# Patient Record
Sex: Male | Born: 1965
Health system: Southern US, Community
[De-identification: ages and names within clinical notes are randomized; demographics above are authoritative.]

## PROBLEM LIST (undated history)

## (undated) DIAGNOSIS — K922 Gastrointestinal hemorrhage, unspecified: Secondary | ICD-10-CM

## (undated) DIAGNOSIS — I10 Essential (primary) hypertension: Secondary | ICD-10-CM

## (undated) DIAGNOSIS — K5792 Diverticulitis of intestine, part unspecified, without perforation or abscess without bleeding: Secondary | ICD-10-CM

## (undated) DIAGNOSIS — R188 Other ascites: Secondary | ICD-10-CM

## (undated) DIAGNOSIS — G8929 Other chronic pain: Secondary | ICD-10-CM

## (undated) DIAGNOSIS — K219 Gastro-esophageal reflux disease without esophagitis: Secondary | ICD-10-CM

## (undated) DIAGNOSIS — J449 Chronic obstructive pulmonary disease, unspecified: Secondary | ICD-10-CM

## (undated) DIAGNOSIS — M549 Dorsalgia, unspecified: Secondary | ICD-10-CM

## (undated) DIAGNOSIS — W3400XA Accidental discharge from unspecified firearms or gun, initial encounter: Secondary | ICD-10-CM

## (undated) DIAGNOSIS — D126 Benign neoplasm of colon, unspecified: Secondary | ICD-10-CM

## (undated) DIAGNOSIS — E43 Unspecified severe protein-calorie malnutrition: Secondary | ICD-10-CM

## (undated) DIAGNOSIS — K7460 Unspecified cirrhosis of liver: Secondary | ICD-10-CM

## (undated) DIAGNOSIS — J189 Pneumonia, unspecified organism: Secondary | ICD-10-CM

## (undated) DIAGNOSIS — R55 Syncope and collapse: Secondary | ICD-10-CM

## (undated) HISTORY — PX: SMALL INTESTINE SURGERY: SHX150

---

## 2006-01-25 ENCOUNTER — Ambulatory Visit (HOSPITAL_COMMUNITY): Admission: RE | Admit: 2006-01-25 | Discharge: 2006-01-25 | Payer: Self-pay | Admitting: Family Medicine

## 2006-02-02 ENCOUNTER — Ambulatory Visit: Payer: Self-pay | Admitting: Internal Medicine

## 2011-02-13 ENCOUNTER — Encounter: Payer: Self-pay | Admitting: Emergency Medicine

## 2011-02-13 ENCOUNTER — Emergency Department (HOSPITAL_COMMUNITY)
Admission: EM | Admit: 2011-02-13 | Discharge: 2011-02-13 | Disposition: A | Discharge status: Other Acute Inpatient Hospital | Payer: BC Managed Care – PPO | Attending: Emergency Medicine | Admitting: Emergency Medicine

## 2011-02-13 ENCOUNTER — Emergency Department (HOSPITAL_COMMUNITY): Payer: BC Managed Care – PPO

## 2011-02-13 ENCOUNTER — Other Ambulatory Visit: Payer: Self-pay

## 2011-02-13 DIAGNOSIS — R079 Chest pain, unspecified: Secondary | ICD-10-CM | POA: Insufficient documentation

## 2011-02-13 DIAGNOSIS — R61 Generalized hyperhidrosis: Secondary | ICD-10-CM | POA: Insufficient documentation

## 2011-02-13 DIAGNOSIS — M79609 Pain in unspecified limb: Secondary | ICD-10-CM | POA: Insufficient documentation

## 2011-02-13 DIAGNOSIS — J4489 Other specified chronic obstructive pulmonary disease: Secondary | ICD-10-CM | POA: Insufficient documentation

## 2011-02-13 DIAGNOSIS — R0789 Other chest pain: Secondary | ICD-10-CM | POA: Diagnosis present

## 2011-02-13 DIAGNOSIS — J449 Chronic obstructive pulmonary disease, unspecified: Secondary | ICD-10-CM | POA: Insufficient documentation

## 2011-02-13 DIAGNOSIS — R0602 Shortness of breath: Secondary | ICD-10-CM | POA: Insufficient documentation

## 2011-02-13 DIAGNOSIS — Z720 Tobacco use: Secondary | ICD-10-CM | POA: Diagnosis present

## 2011-02-13 LAB — COMPREHENSIVE METABOLIC PANEL
ALT: 25 U/L (ref 0–53)
AST: 22 U/L (ref 0–37)
Alkaline Phosphatase: 50 U/L (ref 39–117)
CO2: 23 mEq/L (ref 19–32)
Calcium: 8.9 mg/dL (ref 8.4–10.5)
Chloride: 102 mEq/L (ref 96–112)
GFR calc Af Amer: >60 mL/min (ref >60)
GFR calc non Af Amer: >60 mL/min (ref >60)
Glucose, Bld: 128 mg/dL — ABNORMAL HIGH (ref 70–99)
Potassium: 4.3 mEq/L (ref 3.5–5.1)
Sodium: 137 mEq/L (ref 135–145)

## 2011-02-13 LAB — CARDIAC PANEL(CRET KIN+CKTOT+MB+TROPI)
CK, MB: 3.6 ng/mL (ref 0.3–4.0)
Total CK: 84 U/L (ref 7–232)
Troponin I: <0.3 ng/mL (ref <0.30)
Troponin I: <0.3 ng/mL (ref <0.30)

## 2011-02-13 LAB — CBC
Hemoglobin: 16.6 g/dL (ref 13.0–17.0)
Platelets: 279 10*3/uL (ref 150–400)
RBC: 4.89 MIL/uL (ref 4.22–5.81)
WBC: 8.3 10*3/uL (ref 4.0–10.5)

## 2011-02-13 MED ORDER — NITROGLYCERIN 0.4 MG SL SUBL
0.4000 mg | SUBLINGUAL_TABLET | Freq: Three times a day (TID) | SUBLINGUAL | Status: DC | PRN
  Administered 2011-02-13 (×2): 0.4 mg via SUBLINGUAL
  Filled 2011-02-13: qty 25

## 2011-02-13 MED ORDER — SODIUM CHLORIDE 0.9 % IV SOLN: Freq: Once | INTRAVENOUS | Status: DC

## 2011-02-13 MED ORDER — SODIUM CHLORIDE 0.9 % IV SOLN: INTRAVENOUS | Status: DC

## 2011-02-13 MED ORDER — ASPIRIN 81 MG PO CHEW
324.0000 mg | CHEWABLE_TABLET | Freq: Once | ORAL | Status: AC
  Administered 2011-02-13: 324 mg via ORAL
  Filled 2011-02-13: qty 4

## 2011-02-13 NOTE — ED Notes (Signed)
Pt c/o sudden onset of chest pain with bilateral arm pain today. Pt states he was sweating and had blurred vision.

## 2011-02-13 NOTE — ED Notes (Signed)
Family at bedside. 

## 2011-02-13 NOTE — ED Notes (Signed)
Pt showing NSR on monitor. Eating lunch tray with no complaints.

## 2011-02-13 NOTE — ED Notes (Signed)
Pt c/o sharp and tight feeling to his left chest. States that when it started it hurt all the way across his chest and radiated to his shoulders and down to his arm pits. Pt also c/o shortness of breath with onset but denies at this time. Also c/o pain in his lower back and states that he injured it this weekend. Pt showing NSR on CCM.

## 2011-02-13 NOTE — ED Notes (Signed)
Awaiting Dr. Lendell Caprice to make decision to admit or discharge patient after reviewing repeat cardiac enzymes.

## 2011-02-13 NOTE — ED Notes (Signed)
Pt resting with no complaints of pain at present. Pt showing NSR on CCM. Family at bedside. IV infusing with no edema or redness.
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
## 2011-02-13 NOTE — ED Notes (Signed)
Pt showing NSR on CCM.

## 2012-03-12 ENCOUNTER — Other Ambulatory Visit (HOSPITAL_COMMUNITY): Payer: Self-pay | Admitting: Physical Medicine and Rehabilitation

## 2012-03-12 DIAGNOSIS — M545 Low back pain: Secondary | ICD-10-CM

## 2012-03-13 ENCOUNTER — Ambulatory Visit (HOSPITAL_COMMUNITY)
Admission: RE | Admit: 2012-03-13 | Discharge: 2012-03-13 | Disposition: A | Discharge status: Home / Self Care | Payer: BC Managed Care – PPO | Source: Ambulatory Visit | Attending: Physical Medicine and Rehabilitation | Admitting: Physical Medicine and Rehabilitation

## 2012-03-13 DIAGNOSIS — M25559 Pain in unspecified hip: Secondary | ICD-10-CM | POA: Insufficient documentation

## 2012-03-13 DIAGNOSIS — M51379 Other intervertebral disc degeneration, lumbosacral region without mention of lumbar back pain or lower extremity pain: Secondary | ICD-10-CM | POA: Insufficient documentation

## 2012-03-13 DIAGNOSIS — M5137 Other intervertebral disc degeneration, lumbosacral region: Secondary | ICD-10-CM | POA: Insufficient documentation

## 2012-03-13 DIAGNOSIS — M5126 Other intervertebral disc displacement, lumbar region: Secondary | ICD-10-CM | POA: Insufficient documentation

## 2012-03-13 DIAGNOSIS — M545 Low back pain, unspecified: Secondary | ICD-10-CM | POA: Insufficient documentation

## 2012-09-04 ENCOUNTER — Other Ambulatory Visit (HOSPITAL_COMMUNITY): Payer: Self-pay | Admitting: Orthopedic Surgery

## 2012-09-04 DIAGNOSIS — M545 Low back pain: Secondary | ICD-10-CM

## 2012-09-06 ENCOUNTER — Ambulatory Visit (HOSPITAL_COMMUNITY): Payer: BC Managed Care – PPO

## 2012-09-10 ENCOUNTER — Encounter (HOSPITAL_COMMUNITY): Payer: Self-pay

## 2012-09-10 ENCOUNTER — Ambulatory Visit (HOSPITAL_COMMUNITY)
Admission: RE | Admit: 2012-09-10 | Discharge: 2012-09-10 | Disposition: A | Discharge status: Home / Self Care | Payer: BC Managed Care – PPO | Source: Ambulatory Visit | Attending: Orthopedic Surgery | Admitting: Orthopedic Surgery

## 2012-09-10 DIAGNOSIS — M545 Low back pain, unspecified: Secondary | ICD-10-CM | POA: Insufficient documentation

## 2012-09-10 DIAGNOSIS — M5126 Other intervertebral disc displacement, lumbar region: Secondary | ICD-10-CM | POA: Insufficient documentation

## 2012-09-10 DIAGNOSIS — M5137 Other intervertebral disc degeneration, lumbosacral region: Secondary | ICD-10-CM | POA: Insufficient documentation

## 2012-09-10 DIAGNOSIS — M51379 Other intervertebral disc degeneration, lumbosacral region without mention of lumbar back pain or lower extremity pain: Secondary | ICD-10-CM | POA: Insufficient documentation

## 2012-09-10 DIAGNOSIS — M79609 Pain in unspecified limb: Secondary | ICD-10-CM | POA: Insufficient documentation

## 2012-09-13 ENCOUNTER — Encounter (HOSPITAL_COMMUNITY): Payer: Self-pay | Admitting: Pharmacy Technician

## 2012-09-17 NOTE — H&P (Signed)
History of Present Illness The patient is a 47 year old male who presents today for follow up of their back. The patient is being followed for their left-sided back pain. They are now 6 month(s) out from when symptoms began. Symptoms reported today include: pain (lower lumbar with left lower ext. pain to the level of the foot with some occassional right lower ext. pain ), weakness (left lower ext. ) and numbness (left lower ext. ), while the patient does not report symptoms of: urinary incontinence. Current treatment includes: activity modification. The following medication has been used for pain control: none. The patient presents today following MRI.   He returns today for follow up. He continues to have severe debilitating back, buttock and left leg pain. This has been going on now for several months but has gotten significantly worse. The patient has been having significant back pain since October of 2013 but it has gotten horrifically worse in the last two months. He has had bilateral S1 selective nerve root block on 07/31/12 and has had a right S1 nerve block on 04/17/12, neither of which provided any significant relief.  Allergies No Known Allergies.    Social History Tobacco use. current every day smoker; smoke(d) 1 1/2 pack(s) per day   Objective Transcription  He is a pleasant gentleman who appears younger than his stated age. He is alert and oriented times three. No shortness of breath or chest pain. Lungs are clear to auscultation. No rubs, gallops or murmurs. Regular rate and rhythm. Abdomen soft and nontender. No incontinence of bowel and bladder. Positive left straight leg raise test. Positive contralateral straight leg raise test. He has 4/5 EHL and gastrocnemius strength on the left with positive foot drop. No right sided neurologic motor deficits. Sensation in the S1 distribution is decreased to light touch. He has 1+ symmetrical deep tendon reflexes. Compartments are  soft, nontender. Negative Babinski test. No clonus. No real hip, knee or ankle pain with joint range of motion.    RADIOGRAPHS:  His MRI dated 09/10/12 shows a very large new left L5-S1 disc herniation. When compared to his previous MRI this is a definite change. There is some mild degenerative changes at L4-5 and L3-4 but no other significant abnormalities.    Assessment & Plan  At this point in time the patient's clinical exam and MRI findings are consistent with an S1 radiculopathy with a neurological deficit. The MRI shows new changes. At this point having had previous injections and now having neurological deficit (foot drop) along with the new MRI findings at this point I think he has tried and failed appropriate conservative management and surgical intervention is justified. This would be a lumbar microdiscectomy. The risks include infection, bleeding, nerve damage, death, stroke, paralysis, failure to heal, need for further surgery, ongoing or worse pain, loss of bowel and bladder control, recurrent disc herniation, need for further surgery. At this point in time both he and his wife her present for the dictation. All of their questions were addressed. I discussed surgery and will move forward in a rapid fashion.

## 2012-09-18 ENCOUNTER — Encounter (HOSPITAL_COMMUNITY): Payer: Self-pay | Admitting: *Deleted

## 2012-09-18 MED ORDER — CEFAZOLIN SODIUM-DEXTROSE 2-3 GM-% IV SOLR
2.0000 g | INTRAVENOUS | Status: AC
  Administered 2012-09-19: 2 g via INTRAVENOUS
  Filled 2012-09-18: qty 50

## 2012-09-18 NOTE — Progress Notes (Signed)
Pt states that Brace will be delivered DOS (09/19/12) by [facility]. Pt denies SOB, chest pain and being seen by a cardiologist. Pt made aware to stop taking NSAIDS (Advil,Motrin). Pt states that he has not taken any NSAIDs for over the past 2 weeks.

## 2012-09-19 ENCOUNTER — Encounter (HOSPITAL_COMMUNITY)
Admission: RE | Disposition: A | Discharge status: Home / Self Care | Payer: Self-pay | Source: Ambulatory Visit | Attending: Orthopedic Surgery

## 2012-09-19 ENCOUNTER — Observation Stay (HOSPITAL_COMMUNITY)
Admission: RE | Admit: 2012-09-19 | Discharge: 2012-09-19 | Disposition: A | Discharge status: Home / Self Care | Payer: BC Managed Care – PPO | Source: Ambulatory Visit | Attending: Orthopedic Surgery | Admitting: Orthopedic Surgery

## 2012-09-19 ENCOUNTER — Encounter (HOSPITAL_COMMUNITY): Payer: Self-pay | Admitting: *Deleted

## 2012-09-19 ENCOUNTER — Encounter (HOSPITAL_COMMUNITY): Payer: Self-pay | Admitting: Anesthesiology

## 2012-09-19 ENCOUNTER — Ambulatory Visit (HOSPITAL_COMMUNITY): Payer: BC Managed Care – PPO

## 2012-09-19 ENCOUNTER — Ambulatory Visit (HOSPITAL_COMMUNITY): Payer: BC Managed Care – PPO | Admitting: Anesthesiology

## 2012-09-19 DIAGNOSIS — F17200 Nicotine dependence, unspecified, uncomplicated: Secondary | ICD-10-CM | POA: Insufficient documentation

## 2012-09-19 DIAGNOSIS — M5126 Other intervertebral disc displacement, lumbar region: Principal | ICD-10-CM | POA: Insufficient documentation

## 2012-09-19 HISTORY — DX: Gastro-esophageal reflux disease without esophagitis: K21.9

## 2012-09-19 HISTORY — DX: Accidental discharge from unspecified firearms or gun, initial encounter: W34.00XA

## 2012-09-19 HISTORY — PX: LUMBAR LAMINECTOMY/DECOMPRESSION MICRODISCECTOMY: SHX5026

## 2012-09-19 LAB — CBC
HCT: 45.2 % (ref 39.0–52.0)
MCH: 33.5 pg (ref 26.0–34.0)
MCHC: 36.1 g/dL — ABNORMAL HIGH (ref 30.0–36.0)
RDW: 13.1 % (ref 11.5–15.5)

## 2012-09-19 LAB — SURGICAL PCR SCREEN
MRSA, PCR: NEGATIVE
Staphylococcus aureus: POSITIVE — AB

## 2012-09-19 SURGERY — LUMBAR LAMINECTOMY/DECOMPRESSION MICRODISCECTOMY 1 LEVEL
Anesthesia: General | Site: Back | Laterality: Left | Wound class: Clean

## 2012-09-19 MED ORDER — NEOSTIGMINE METHYLSULFATE 1 MG/ML IJ SOLN
INTRAMUSCULAR | Status: DC | PRN
  Administered 2012-09-19: 3 mg via INTRAVENOUS

## 2012-09-19 MED ORDER — 0.9 % SODIUM CHLORIDE (POUR BTL) OPTIME
TOPICAL | Status: DC | PRN
  Administered 2012-09-19: 1000 mL

## 2012-09-19 MED ORDER — METHOCARBAMOL 500 MG PO TABS: 500.0000 mg | ORAL_TABLET | Freq: Four times a day (QID) | ORAL | Status: DC | PRN

## 2012-09-19 MED ORDER — MEPERIDINE HCL 25 MG/ML IJ SOLN: 6.2500 mg | INTRAMUSCULAR | Status: DC | PRN

## 2012-09-19 MED ORDER — ARTIFICIAL TEARS OP OINT
TOPICAL_OINTMENT | OPHTHALMIC | Status: DC | PRN
  Administered 2012-09-19: 1 via OPHTHALMIC

## 2012-09-19 MED ORDER — PHENOL 1.4 % MT LIQD: 1.0000 | OROMUCOSAL | Status: DC | PRN

## 2012-09-19 MED ORDER — MENTHOL 3 MG MT LOZG: 1.0000 | LOZENGE | OROMUCOSAL | Status: DC | PRN

## 2012-09-19 MED ORDER — ONDANSETRON HCL 4 MG/2ML IJ SOLN
INTRAMUSCULAR | Status: DC | PRN
  Administered 2012-09-19: 4 mg via INTRAVENOUS

## 2012-09-19 MED ORDER — GLYCOPYRROLATE 0.2 MG/ML IJ SOLN
INTRAMUSCULAR | Status: DC | PRN
  Administered 2012-09-19: 0.6 mg via INTRAVENOUS

## 2012-09-19 MED ORDER — OXYCODONE HCL 5 MG PO TABS: 5.0000 mg | ORAL_TABLET | Freq: Once | ORAL | Status: DC | PRN

## 2012-09-19 MED ORDER — DEXAMETHASONE SODIUM PHOSPHATE 4 MG/ML IJ SOLN
4.0000 mg | Freq: Four times a day (QID) | INTRAMUSCULAR | Status: DC
  Filled 2012-09-19 (×4): qty 1

## 2012-09-19 MED ORDER — CEFAZOLIN SODIUM 1-5 GM-% IV SOLN
1.0000 g | Freq: Three times a day (TID) | INTRAVENOUS | Status: DC
  Administered 2012-09-19: 1 g via INTRAVENOUS
  Filled 2012-09-19 (×2): qty 50

## 2012-09-19 MED ORDER — DEXAMETHASONE 4 MG PO TABS
4.0000 mg | ORAL_TABLET | Freq: Four times a day (QID) | ORAL | Status: DC
  Administered 2012-09-19: 4 mg via ORAL
  Filled 2012-09-19 (×4): qty 1

## 2012-09-19 MED ORDER — PNEUMOCOCCAL VAC POLYVALENT 25 MCG/0.5ML IJ INJ: 0.5000 mL | INJECTION | INTRAMUSCULAR | Status: DC

## 2012-09-19 MED ORDER — METHOCARBAMOL 100 MG/ML IJ SOLN: 500.0000 mg | Freq: Four times a day (QID) | INTRAVENOUS | Status: DC | PRN

## 2012-09-19 MED ORDER — MIDAZOLAM HCL 5 MG/5ML IJ SOLN
INTRAMUSCULAR | Status: DC | PRN
  Administered 2012-09-19: 2 mg via INTRAVENOUS

## 2012-09-19 MED ORDER — MORPHINE SULFATE 2 MG/ML IJ SOLN: 1.0000 mg | INTRAMUSCULAR | Status: DC | PRN

## 2012-09-19 MED ORDER — CEFAZOLIN SODIUM-DEXTROSE 2-3 GM-% IV SOLR
INTRAVENOUS | Status: DC | PRN
  Administered 2012-09-19: 2 g via INTRAVENOUS

## 2012-09-19 MED ORDER — DEXAMETHASONE SODIUM PHOSPHATE 4 MG/ML IJ SOLN
4.0000 mg | Freq: Once | INTRAMUSCULAR | Status: AC
  Administered 2012-09-19: 10 mg via INTRAVENOUS

## 2012-09-19 MED ORDER — ONDANSETRON HCL 4 MG PO TABS: 4.0000 mg | ORAL_TABLET | Freq: Three times a day (TID) | ORAL | Status: DC | PRN

## 2012-09-19 MED ORDER — OXYCODONE HCL 5 MG PO TABS: 10.0000 mg | ORAL_TABLET | ORAL | Status: DC | PRN

## 2012-09-19 MED ORDER — BUPIVACAINE-EPINEPHRINE 0.25% -1:200000 IJ SOLN
INTRAMUSCULAR | Status: DC | PRN
  Administered 2012-09-19: 20 mL

## 2012-09-19 MED ORDER — ACETAMINOPHEN 10 MG/ML IV SOLN
1000.0000 mg | Freq: Once | INTRAVENOUS | Status: AC
  Administered 2012-09-19: 1000 mg via INTRAVENOUS

## 2012-09-19 MED ORDER — MUPIROCIN 2 % EX OINT: TOPICAL_OINTMENT | Freq: Two times a day (BID) | CUTANEOUS | Status: DC

## 2012-09-19 MED ORDER — PROPOFOL 10 MG/ML IV BOLUS
INTRAVENOUS | Status: DC | PRN
  Administered 2012-09-19: 30 mg via INTRAVENOUS
  Administered 2012-09-19: 120 mg via INTRAVENOUS
  Administered 2012-09-19: 50 mg via INTRAVENOUS
  Administered 2012-09-19 (×3): 20 mg via INTRAVENOUS

## 2012-09-19 MED ORDER — LIDOCAINE HCL 4 % MT SOLN
OROMUCOSAL | Status: DC | PRN
  Administered 2012-09-19: 4 mL via TOPICAL

## 2012-09-19 MED ORDER — SODIUM CHLORIDE 0.9 % IV SOLN: 250.0000 mL | INTRAVENOUS | Status: DC

## 2012-09-19 MED ORDER — THROMBIN 20000 UNITS EX SOLR
CUTANEOUS | Status: AC
  Filled 2012-09-19: qty 20000

## 2012-09-19 MED ORDER — BUPIVACAINE-EPINEPHRINE 0.25% -1:200000 IJ SOLN
INTRAMUSCULAR | Status: AC
  Filled 2012-09-19: qty 1

## 2012-09-19 MED ORDER — MUPIROCIN 2 % EX OINT: TOPICAL_OINTMENT | Freq: Once | CUTANEOUS | Status: DC

## 2012-09-19 MED ORDER — ROCURONIUM BROMIDE 100 MG/10ML IV SOLN
INTRAVENOUS | Status: DC | PRN
  Administered 2012-09-19: 10 mg via INTRAVENOUS
  Administered 2012-09-19: 20 mg via INTRAVENOUS
  Administered 2012-09-19: 50 mg via INTRAVENOUS
  Administered 2012-09-19 (×2): 5 mg via INTRAVENOUS

## 2012-09-19 MED ORDER — FENTANYL CITRATE 0.05 MG/ML IJ SOLN
INTRAMUSCULAR | Status: DC | PRN
  Administered 2012-09-19 (×3): 50 ug via INTRAVENOUS
  Administered 2012-09-19 (×3): 100 ug via INTRAVENOUS
  Administered 2012-09-19: 50 ug via INTRAVENOUS

## 2012-09-19 MED ORDER — HEMOSTATIC AGENTS (NO CHARGE) OPTIME
TOPICAL | Status: DC | PRN
  Administered 2012-09-19: 1

## 2012-09-19 MED ORDER — LACTATED RINGERS IV SOLN
INTRAVENOUS | Status: DC
  Administered 2012-09-19: 08:00:00 via INTRAVENOUS

## 2012-09-19 MED ORDER — ACETAMINOPHEN 10 MG/ML IV SOLN
1000.0000 mg | Freq: Four times a day (QID) | INTRAVENOUS | Status: DC
  Administered 2012-09-19: 1000 mg via INTRAVENOUS
  Filled 2012-09-19 (×3): qty 100

## 2012-09-19 MED ORDER — FLEET ENEMA 7-19 GM/118ML RE ENEM: 1.0000 | ENEMA | Freq: Once | RECTAL | Status: DC | PRN

## 2012-09-19 MED ORDER — ONDANSETRON HCL 4 MG/2ML IJ SOLN: 4.0000 mg | INTRAMUSCULAR | Status: DC | PRN

## 2012-09-19 MED ORDER — ZOLPIDEM TARTRATE 5 MG PO TABS: 5.0000 mg | ORAL_TABLET | Freq: Every evening | ORAL | Status: DC | PRN

## 2012-09-19 MED ORDER — LIDOCAINE HCL (CARDIAC) 20 MG/ML IV SOLN
INTRAVENOUS | Status: DC | PRN
  Administered 2012-09-19: 80 mg via INTRAVENOUS

## 2012-09-19 MED ORDER — LACTATED RINGERS IV SOLN
INTRAVENOUS | Status: DC | PRN
  Administered 2012-09-19 (×3): via INTRAVENOUS

## 2012-09-19 MED ORDER — SODIUM CHLORIDE 0.9 % IJ SOLN: 3.0000 mL | INTRAMUSCULAR | Status: DC | PRN

## 2012-09-19 MED ORDER — HYDROMORPHONE HCL PF 1 MG/ML IJ SOLN
0.2500 mg | INTRAMUSCULAR | Status: DC | PRN
  Administered 2012-09-19 (×2): 0.5 mg via INTRAVENOUS

## 2012-09-19 MED ORDER — MUPIROCIN 2 % EX OINT
TOPICAL_OINTMENT | CUTANEOUS | Status: AC
  Filled 2012-09-19: qty 22

## 2012-09-19 MED ORDER — ACETAMINOPHEN 10 MG/ML IV SOLN
INTRAVENOUS | Status: AC
  Filled 2012-09-19: qty 100

## 2012-09-19 MED ORDER — OXYCODONE-ACETAMINOPHEN 10-325 MG PO TABS: 1.0000 | ORAL_TABLET | ORAL | Status: DC | PRN

## 2012-09-19 MED ORDER — DOCUSATE SODIUM 100 MG PO CAPS: 100.0000 mg | ORAL_CAPSULE | Freq: Two times a day (BID) | ORAL | Status: DC

## 2012-09-19 MED ORDER — HYDROMORPHONE HCL PF 1 MG/ML IJ SOLN
INTRAMUSCULAR | Status: AC
  Filled 2012-09-19: qty 1

## 2012-09-19 MED ORDER — METHOCARBAMOL 500 MG PO TABS: 500.0000 mg | ORAL_TABLET | Freq: Three times a day (TID) | ORAL | Status: DC | PRN

## 2012-09-19 MED ORDER — LACTATED RINGERS IV SOLN: INTRAVENOUS | Status: DC

## 2012-09-19 MED ORDER — THROMBIN 20000 UNITS EX SOLR
OROMUCOSAL | Status: DC | PRN
  Administered 2012-09-19: 10:00:00 via TOPICAL

## 2012-09-19 MED ORDER — SODIUM CHLORIDE 0.9 % IJ SOLN: 3.0000 mL | Freq: Two times a day (BID) | INTRAMUSCULAR | Status: DC

## 2012-09-19 MED ORDER — OXYCODONE HCL 5 MG/5ML PO SOLN: 5.0000 mg | Freq: Once | ORAL | Status: DC | PRN

## 2012-09-19 MED ORDER — DEXAMETHASONE SODIUM PHOSPHATE 10 MG/ML IJ SOLN
INTRAMUSCULAR | Status: AC
  Filled 2012-09-19: qty 1

## 2012-09-19 MED ORDER — INFLUENZA VIRUS VACC SPLIT PF IM SUSP: 0.5000 mL | INTRAMUSCULAR | Status: DC | PRN

## 2012-09-19 MED ORDER — DOCUSATE SODIUM 100 MG PO CAPS: 100.0000 mg | ORAL_CAPSULE | Freq: Three times a day (TID) | ORAL | Status: DC | PRN

## 2012-09-19 MED ORDER — ONDANSETRON HCL 4 MG/2ML IJ SOLN: 4.0000 mg | Freq: Once | INTRAMUSCULAR | Status: DC | PRN

## 2012-09-19 SURGICAL SUPPLY — 52 items
BANDAGE GAUZE ELAST BULKY 4 IN (GAUZE/BANDAGES/DRESSINGS) ×2 IMPLANT
BUR EGG ELITE 4.0 (BURR) IMPLANT
BUR MATCHSTICK NEURO 3.0 LAGG (BURR) IMPLANT
CANISTER SUCTION 2500CC (MISCELLANEOUS) ×2 IMPLANT
CLOTH BEACON ORANGE TIMEOUT ST (SAFETY) ×2 IMPLANT
CLSR STERI-STRIP ANTIMIC 1/2X4 (GAUZE/BANDAGES/DRESSINGS) ×2 IMPLANT
CORDS BIPOLAR (ELECTRODE) ×2 IMPLANT
COVER SURGICAL LIGHT HANDLE (MISCELLANEOUS) ×2 IMPLANT
DECANTER SPIKE VIAL GLASS SM (MISCELLANEOUS) ×2 IMPLANT
DRAIN CHANNEL 15F RND FF W/TCR (WOUND CARE) IMPLANT
DRAPE POUCH INSTRU U-SHP 10X18 (DRAPES) ×2 IMPLANT
DRAPE SURG 17X23 STRL (DRAPES) ×2 IMPLANT
DRAPE U-SHAPE 47X51 STRL (DRAPES) ×2 IMPLANT
DRSG MEPILEX BORDER 4X8 (GAUZE/BANDAGES/DRESSINGS) ×2 IMPLANT
DURAPREP 26ML APPLICATOR (WOUND CARE) ×2 IMPLANT
ELECT BLADE 4.0 EZ CLEAN MEGAD (MISCELLANEOUS)
ELECT CAUTERY BLADE 6.4 (BLADE) ×2 IMPLANT
ELECT REM PT RETURN 9FT ADLT (ELECTROSURGICAL) ×2
ELECTRODE BLDE 4.0 EZ CLN MEGD (MISCELLANEOUS) IMPLANT
ELECTRODE REM PT RTRN 9FT ADLT (ELECTROSURGICAL) ×1 IMPLANT
EVACUATOR 1/8 PVC DRAIN (DRAIN) IMPLANT
EVACUATOR SILICONE 100CC (DRAIN) ×2 IMPLANT
GLOVE BIOGEL PI IND STRL 8.5 (GLOVE) ×1 IMPLANT
GLOVE BIOGEL PI INDICATOR 8.5 (GLOVE) ×1
GLOVE ECLIPSE 8.5 STRL (GLOVE) ×2 IMPLANT
GOWN PREVENTION PLUS XXLARGE (GOWN DISPOSABLE) ×2 IMPLANT
GOWN STRL NON-REIN LRG LVL3 (GOWN DISPOSABLE) ×4 IMPLANT
GOWN STRL REIN XL XLG (GOWN DISPOSABLE) ×2 IMPLANT
KIT BASIN OR (CUSTOM PROCEDURE TRAY) ×2 IMPLANT
KIT ROOM TURNOVER OR (KITS) ×2 IMPLANT
NEEDLE 22X1 1/2 (OR ONLY) (NEEDLE) ×2 IMPLANT
NEEDLE SPNL 18GX3.5 QUINCKE PK (NEEDLE) ×4 IMPLANT
NS IRRIG 1000ML POUR BTL (IV SOLUTION) ×2 IMPLANT
PACK LAMINECTOMY ORTHO (CUSTOM PROCEDURE TRAY) ×2 IMPLANT
PACK UNIVERSAL I (CUSTOM PROCEDURE TRAY) ×2 IMPLANT
PAD ARMBOARD 7.5X6 YLW CONV (MISCELLANEOUS) ×4 IMPLANT
PATTIES SURGICAL .5 X.5 (GAUZE/BANDAGES/DRESSINGS) ×2 IMPLANT
PATTIES SURGICAL .5 X1 (DISPOSABLE) ×2 IMPLANT
SPONGE SURGIFOAM ABS GEL 100 (HEMOSTASIS) ×2 IMPLANT
SURGIFLO TRUKIT (HEMOSTASIS) ×2 IMPLANT
SUT MNCRL AB 3-0 PS2 18 (SUTURE) ×2 IMPLANT
SUT VIC AB 0 CT1 27 (SUTURE) ×2
SUT VIC AB 0 CT1 27XBRD ANBCTR (SUTURE) ×1 IMPLANT
SUT VIC AB 1 CTX 36 (SUTURE) ×2
SUT VIC AB 1 CTX36XBRD ANBCTR (SUTURE) ×2 IMPLANT
SUT VIC AB 2-0 CT1 18 (SUTURE) ×2 IMPLANT
SYR BULB IRRIGATION 50ML (SYRINGE) ×2 IMPLANT
SYR CONTROL 10ML LL (SYRINGE) ×2 IMPLANT
TOWEL OR 17X24 6PK STRL BLUE (TOWEL DISPOSABLE) ×2 IMPLANT
TOWEL OR 17X26 10 PK STRL BLUE (TOWEL DISPOSABLE) ×2 IMPLANT
WATER STERILE IRR 1000ML POUR (IV SOLUTION) ×2 IMPLANT
YANKAUER SUCT BULB TIP NO VENT (SUCTIONS) ×2 IMPLANT

## 2012-09-19 NOTE — Preoperative (Signed)
Beta Blockers   Reason not to administer Beta Blockers:Not Applicable 

## 2012-09-19 NOTE — Transfer of Care (Signed)
Immediate Anesthesia Transfer of Care Note  Patient: Arthur Johnson  Procedure(s) Performed: Procedure(s): DISCECTOMY L5-S1  LEFT (1 LEVEL) (Left)  Patient Location: PACU  Anesthesia Type:General  Level of Consciousness: awake, oriented and patient cooperative  Airway & Oxygen Therapy: Patient Spontanous Breathing and Patient connected to nasal cannula oxygen  Post-op Assessment: Report given to PACU RN and Post -op Vital signs reviewed and stable  Post vital signs: Reviewed and stable  Complications: No apparent anesthesia complications

## 2012-09-19 NOTE — Anesthesia Preprocedure Evaluation (Addendum)
Anesthesia Evaluation  Patient identified by MRN, date of birth, ID band Patient awake    Reviewed: Allergy & Precautions, H&P , NPO status , Patient's Chart, lab work & pertinent test results  History of Anesthesia Complications Negative for: history of anesthetic complications  Airway Mallampati: II TM Distance: >3 FB Neck ROM: Full    Dental  (+) Teeth Intact and Dental Advisory Given   Pulmonary Current Smoker,  1+ ppd x 25 years         Cardiovascular negative cardio ROS  Rhythm:Regular Rate:Normal     Neuro/Psych negative psych ROS   GI/Hepatic Neg liver ROS, GERD-  Controlled,  Endo/Other  negative endocrine ROS  Renal/GU negative Renal ROS     Musculoskeletal   Abdominal   Peds  Hematology negative hematology ROS (+)   Anesthesia Other Findings   Reproductive/Obstetrics negative OB ROS                          Anesthesia Physical Anesthesia Plan  ASA: II  Anesthesia Plan: General ETT   Post-op Pain Management:    Induction:   Airway Management Planned:   Additional Equipment:   Intra-op Plan:   Post-operative Plan:   Informed Consent: I have reviewed the patients History and Physical, chart, labs and discussed the procedure including the risks, benefits and alternatives for the proposed anesthesia with the patient or authorized representative who has indicated his/her understanding and acceptance.     Plan Discussed with: Anesthesiologist and Surgeon  Anesthesia Plan Comments:         Anesthesia Quick Evaluation

## 2012-09-19 NOTE — Brief Op Note (Signed)
09/19/2012  11:22 AM  PATIENT:  Laruth Bouchard  47 y.o. male  PRE-OPERATIVE DIAGNOSIS:  L5-S1 LEFT HNP   POST-OPERATIVE DIAGNOSIS:  L5-S1 LEFT HNP   PROCEDURE:  Procedure(s): DISCECTOMY L5-S1  LEFT (1 LEVEL) (Left)  SURGEON:  Surgeon(s) and Role:    * Venita Lick, MD - Primary  PHYSICIAN ASSISTANT:   ASSISTANTS: none   ANESTHESIA:   general  EBL:  Total I/O In: 2400 [I.V.:2400] Out: 250 [Blood:250]  BLOOD ADMINISTERED:none  DRAINS: none   LOCAL MEDICATIONS USED:  MARCAINE     SPECIMEN:  No Specimen  DISPOSITION OF SPECIMEN:  N/A  COUNTS:  YES  TOURNIQUET:  * No tourniquets in log *  DICTATION: .Other Dictation: Dictation Number [id number]  PLAN OF CARE: Admit for overnight observation  PATIENT DISPOSITION:  PACU - hemodynamically stable.

## 2012-09-19 NOTE — Anesthesia Postprocedure Evaluation (Signed)
Anesthesia Post Note  Patient: Arthur Johnson  Procedure(s) Performed: Procedure(s) (LRB): DISCECTOMY L5-S1  LEFT (1 LEVEL) (Left)  Anesthesia type: general  Patient location: PACU  Post pain: Pain level controlled  Post assessment: Patient's Cardiovascular Status Stable  Last Vitals:  Filed Vitals:   09/19/12 1325  BP:   Pulse:   Temp: 36.1 C  Resp:     Post vital signs: Reviewed and stable  Level of consciousness: sedated  Complications: No apparent anesthesia complications

## 2012-09-19 NOTE — Anesthesia Procedure Notes (Signed)
Procedure Name: Intubation Date/Time: 09/19/2012 9:08 AM Performed by: Lovie Chol Pre-anesthesia Checklist: Patient identified, Emergency Drugs available, Suction available, Patient being monitored and Timeout performed Patient Re-evaluated:Patient Re-evaluated prior to inductionOxygen Delivery Method: Circle system utilized Preoxygenation: Pre-oxygenation with 100% oxygen Intubation Type: IV induction Laryngoscope Size: Miller and 2 Grade View: Grade I Tube type: Oral Tube size: 7.5 mm Number of attempts: 1 Airway Equipment and Method: Stylet and LTA kit utilized Placement Confirmation: ETT inserted through vocal cords under direct vision,  positive ETCO2,  CO2 detector and breath sounds checked- equal and bilateral Secured at: 22 cm Tube secured with: Tape Dental Injury: Teeth and Oropharynx as per pre-operative assessment

## 2012-09-19 NOTE — Progress Notes (Signed)
Discharge instruction and prescription provided.  Client stated understanding of discharge teaching. Client will call Dr. Shon Baton office to set up follow up appointment in two week.

## 2012-09-19 NOTE — H&P (Signed)
No change in clinical exam H+P reviewed  

## 2012-09-19 NOTE — Progress Notes (Signed)
UR COMPLETED  

## 2012-09-20 ENCOUNTER — Encounter (HOSPITAL_COMMUNITY): Payer: Self-pay | Admitting: Orthopedic Surgery

## 2012-09-20 NOTE — Op Note (Signed)
NAMEBLAYDE, BACIGALUPI NO.:  [id number]  MEDICAL RECORD NO.:  [id number]  LOCATION:  [id number]                        FACILITY:  [facility]  PHYSICIAN:  Alvy Beal, MD    DATE OF BIRTH:  March 22, 1966  DATE OF PROCEDURE: DATE OF DISCHARGE:  09/19/2012                              OPERATIVE REPORT   PREOPERATIVE DIAGNOSIS:  Large L5-S1 posterolateral to the left disk herniation with S1 radiculopathy.  POSTOPERATIVE DIAGNOSIS:  Large L5-S1 posterolateral to the left disk herniation with S1 radiculopathy.  OPERATIVE PROCEDURE:  L5 laminotomy for diskectomy.  COMPLICATIONS:  None.  CONDITION:  Stable.  INTRAOPERATIVE FINDINGS:  Significantly large disk herniation.  Multiple fragments removed consistent with preoperative MRI findings.  HISTORY:  This is a very pleasant gentleman who has been having progressive debilitating radicular leg pain.  MRI demonstrated a large disk herniation with nerve compression.  Because of the severity of the radiculopathy and the progressive pain, we elected to proceed with surgery.  All appropriate risks, benefits, and alternatives were discussed with the patient and consent was obtained.  OPERATIVE NOTE:  The patient was brought to the operating room and placed supine on the operating table.  After successful induction of general anesthesia and endotracheal intubation, TEDs and SCDs were applied.  He was turned prone onto the [place] frame.  All bony prominences were well padded.  The back was prepped and draped in usual standard fashion.  Once this was completed, time-out was done to confirm patient, procedure, and all other pertinent important data.  Two needles were placed into the back and x-ray was taken to mark out our incision site.  Once this was completed, we then infiltrated the proposed incision site with 0.25% Marcaine.  I then made a midline incision.  Sharp dissection was carried out down to the deep fascia. Deep  fascia was sharply incised and I stripped the paraspinal muscles to expose the L5-S1 space.  Penfield 4 was placed underneath the L5 lamina and a second intraoperative x-ray was taken to confirm that this was the appropriate level.  Once this was confirmed, I placed a Taylor retractor on the lateral side of the facet complex to maintain visualization.  I then performed a laminotomy of L5 using a 2 and 3-mm Kerrison.  I then released the ligamentum flavum from the leading edge of the S1 lamina and then using a 2-mm Kerrison, resected the ligamentum flavum to expose the underlying thecal sac.  The S1 traversing nerve root was significantly dorsally displaced and a very large disk herniation fragment was noted.  I then mobilized the nerve root and the thecal sac to the right and then performed an annulotomy with a 15-blade scalpel. I then got medially multiple large fragments of disk material to be excised.  Once this was done, I was able to take a nerve hook and sweep circumferentially at the disk space.  There were two other large fragments of disk material, one inferiorly along the posterior aspect of the S1 vertebral body and one slightly superior to the disk space.  This was consistent with what was seen on the preoperative MRI.  Once I had all the fragments of disk material out, the  S1 nerve root was completely free of any tension.  I could palpate all the way into the S1 foramen. I used a Public house manager to follow the nerve root, then I used a [employer] to sweep along the posterior aspect of the annulus and vertebral bodies of L5 and S1 to confirm that all the disk fragments and herniated pieces were removed.  I could also sweep superiorly and along the lateral recess.  At this point, I was pleased with the decompression.  I irrigated it copiously with normal saline.  I then identified the venous epidural bleeders and coagulated with bipolar electrocautery.  I then placed a  thrombin-soaked Gelfoam patty over the exposed thecal sac once I had hemostasis.  I then closed the deep fascia with interrupted #1 Vicryl sutures, superficial with 2-0 Vicryl sutures, and 3-0 Monocryl for the skin.  Steri-Strips and dry dressing were applied.  The patient was then extubated and transferred to the PACU without incident.  At the end of the case, all needle and sponge counts were correct.  There was no adverse intraoperative events.     Alvy Beal, MD     DDB/MEDQ  D:  09/19/2012  T:  09/20/2012  Job:  [id number]
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
## 2012-09-25 NOTE — Progress Notes (Signed)
Late Entry: Patient discharge home before SW was able to see patient. Patient had no SW needs. Social Worker will sign off for now as social work intervention is no longer needed. Please consult [place] again if new need arises.   Sabino Niemann, MSW, [phone]

## 2012-12-11 ENCOUNTER — Other Ambulatory Visit (HOSPITAL_COMMUNITY): Payer: Self-pay | Admitting: Orthopedic Surgery

## 2012-12-11 DIAGNOSIS — M545 Low back pain: Secondary | ICD-10-CM

## 2012-12-13 ENCOUNTER — Ambulatory Visit (HOSPITAL_COMMUNITY): Payer: BC Managed Care – PPO

## 2012-12-17 ENCOUNTER — Ambulatory Visit (HOSPITAL_COMMUNITY)
Admission: RE | Admit: 2012-12-17 | Discharge: 2012-12-17 | Disposition: A | Discharge status: Home / Self Care | Payer: BC Managed Care – PPO | Source: Ambulatory Visit | Attending: Orthopedic Surgery | Admitting: Orthopedic Surgery

## 2012-12-17 ENCOUNTER — Encounter (HOSPITAL_COMMUNITY): Payer: Self-pay

## 2012-12-17 DIAGNOSIS — M545 Low back pain, unspecified: Secondary | ICD-10-CM

## 2012-12-17 DIAGNOSIS — M5126 Other intervertebral disc displacement, lumbar region: Secondary | ICD-10-CM | POA: Insufficient documentation

## 2012-12-17 DIAGNOSIS — M5380 Other specified dorsopathies, site unspecified: Secondary | ICD-10-CM | POA: Insufficient documentation

## 2012-12-17 MED ORDER — GADOBENATE DIMEGLUMINE 529 MG/ML IV SOLN
15.0000 mL | Freq: Once | INTRAVENOUS | Status: AC | PRN
  Administered 2012-12-17: 15 mL via INTRAVENOUS

## 2013-04-14 ENCOUNTER — Observation Stay (HOSPITAL_COMMUNITY): Payer: Medicaid Other

## 2013-04-14 ENCOUNTER — Emergency Department (HOSPITAL_COMMUNITY): Payer: Medicaid Other

## 2013-04-14 ENCOUNTER — Observation Stay (HOSPITAL_COMMUNITY)
Admission: EM | Admit: 2013-04-14 | Discharge: 2013-04-15 | Disposition: A | Discharge status: Home / Self Care | Payer: Medicaid Other | Attending: Internal Medicine | Admitting: Internal Medicine

## 2013-04-14 ENCOUNTER — Encounter (HOSPITAL_COMMUNITY): Payer: Self-pay | Admitting: Emergency Medicine

## 2013-04-14 DIAGNOSIS — I10 Essential (primary) hypertension: Secondary | ICD-10-CM

## 2013-04-14 DIAGNOSIS — R7401 Elevation of levels of liver transaminase levels: Secondary | ICD-10-CM | POA: Diagnosis present

## 2013-04-14 DIAGNOSIS — R7402 Elevation of levels of lactic acid dehydrogenase (LDH): Secondary | ICD-10-CM | POA: Insufficient documentation

## 2013-04-14 DIAGNOSIS — Z720 Tobacco use: Secondary | ICD-10-CM | POA: Diagnosis present

## 2013-04-14 DIAGNOSIS — R0789 Other chest pain: Secondary | ICD-10-CM

## 2013-04-14 DIAGNOSIS — E875 Hyperkalemia: Secondary | ICD-10-CM

## 2013-04-14 DIAGNOSIS — R55 Syncope and collapse: Principal | ICD-10-CM

## 2013-04-14 DIAGNOSIS — F17200 Nicotine dependence, unspecified, uncomplicated: Secondary | ICD-10-CM | POA: Insufficient documentation

## 2013-04-14 DIAGNOSIS — M545 Low back pain, unspecified: Secondary | ICD-10-CM

## 2013-04-14 DIAGNOSIS — E871 Hypo-osmolality and hyponatremia: Secondary | ICD-10-CM

## 2013-04-14 DIAGNOSIS — F1010 Alcohol abuse, uncomplicated: Secondary | ICD-10-CM | POA: Diagnosis present

## 2013-04-14 LAB — CBC WITH DIFFERENTIAL/PLATELET
Eosinophils Absolute: 0.1 10*3/uL (ref 0.0–0.7)
Eosinophils Relative: 1 % (ref 0–5)
HCT: 49 % (ref 39.0–52.0)
Hemoglobin: 17.3 g/dL — ABNORMAL HIGH (ref 13.0–17.0)
Lymphocytes Relative: 15 % (ref 12–46)
Lymphs Abs: 1.2 10*3/uL (ref 0.7–4.0)
MCH: 35 pg — ABNORMAL HIGH (ref 26.0–34.0)
MCV: 99.2 fL (ref 78.0–100.0)
Monocytes Relative: 9 % (ref 3–12)
Platelets: 232 10*3/uL (ref 150–400)
RBC: 4.94 MIL/uL (ref 4.22–5.81)
WBC: 8.2 10*3/uL (ref 4.0–10.5)

## 2013-04-14 LAB — URINALYSIS, ROUTINE W REFLEX MICROSCOPIC
Leukocytes, UA: NEGATIVE
Nitrite: NEGATIVE
Protein, ur: NEGATIVE mg/dL
Specific Gravity, Urine: 1.025 (ref 1.005–1.030)
Urobilinogen, UA: 0.2 mg/dL (ref 0.0–1.0)

## 2013-04-14 LAB — TROPONIN I
Troponin I: <0.3 ng/mL (ref <0.30)
Troponin I: <0.3 ng/mL (ref <0.30)
Troponin I: <0.3 ng/mL (ref <0.30)

## 2013-04-14 LAB — COMPREHENSIVE METABOLIC PANEL
ALT: 132 U/L — ABNORMAL HIGH (ref 0–53)
Alkaline Phosphatase: 66 U/L (ref 39–117)
BUN: 7 mg/dL (ref 6–23)
CO2: 26 mEq/L (ref 19–32)
Calcium: 9.4 mg/dL (ref 8.4–10.5)
GFR calc Af Amer: >90 mL/min (ref >90)
GFR calc non Af Amer: >90 mL/min (ref >90)
Glucose, Bld: 117 mg/dL — ABNORMAL HIGH (ref 70–99)
Sodium: 137 mEq/L (ref 135–145)
Total Protein: 7.2 g/dL (ref 6.0–8.3)

## 2013-04-14 LAB — LIPASE, BLOOD: Lipase: 30 U/L (ref 11–59)

## 2013-04-14 LAB — RAPID URINE DRUG SCREEN, HOSP PERFORMED
Amphetamines: NOT DETECTED
Opiates: NOT DETECTED
Tetrahydrocannabinol: NOT DETECTED

## 2013-04-14 LAB — D-DIMER, QUANTITATIVE: D-Dimer, Quant: 0.3 ug/mL-FEU (ref 0.00–0.48)

## 2013-04-14 LAB — CK: Total CK: 55 U/L (ref 7–232)

## 2013-04-14 MED ORDER — SODIUM CHLORIDE 0.9 % IV BOLUS (SEPSIS)
500.0000 mL | Freq: Once | INTRAVENOUS | Status: AC
  Administered 2013-04-14: 500 mL via INTRAVENOUS

## 2013-04-14 MED ORDER — GUAIFENESIN 100 MG/5ML PO SOLN
200.0000 mg | Freq: Once | ORAL | Status: AC
  Administered 2013-04-14: 200 mg via ORAL
  Filled 2013-04-14 (×2): qty 10

## 2013-04-14 MED ORDER — ONDANSETRON HCL 4 MG/2ML IJ SOLN: 4.0000 mg | Freq: Four times a day (QID) | INTRAMUSCULAR | Status: DC | PRN

## 2013-04-14 MED ORDER — HYDROCODONE-ACETAMINOPHEN 5-325 MG PO TABS: 1.0000 | ORAL_TABLET | ORAL | Status: DC | PRN

## 2013-04-14 MED ORDER — ACETAMINOPHEN 650 MG RE SUPP: 650.0000 mg | Freq: Four times a day (QID) | RECTAL | Status: DC | PRN

## 2013-04-14 MED ORDER — PANTOPRAZOLE SODIUM 40 MG PO TBEC
40.0000 mg | DELAYED_RELEASE_TABLET | Freq: Every day | ORAL | Status: DC
  Administered 2013-04-14 – 2013-04-15 (×2): 40 mg via ORAL
  Filled 2013-04-14 (×2): qty 1

## 2013-04-14 MED ORDER — ONDANSETRON HCL 4 MG PO TABS: 4.0000 mg | ORAL_TABLET | Freq: Four times a day (QID) | ORAL | Status: DC | PRN

## 2013-04-14 MED ORDER — SODIUM CHLORIDE 0.9 % IJ SOLN
3.0000 mL | Freq: Two times a day (BID) | INTRAMUSCULAR | Status: DC
  Administered 2013-04-15: 3 mL via INTRAVENOUS

## 2013-04-14 MED ORDER — HYDRALAZINE HCL 20 MG/ML IJ SOLN: 2.0000 mg | Freq: Four times a day (QID) | INTRAMUSCULAR | Status: DC | PRN

## 2013-04-14 MED ORDER — ACETAMINOPHEN 325 MG PO TABS: 650.0000 mg | ORAL_TABLET | Freq: Four times a day (QID) | ORAL | Status: DC | PRN

## 2013-04-14 MED ORDER — ALUM & MAG HYDROXIDE-SIMETH 200-200-20 MG/5ML PO SUSP: 30.0000 mL | Freq: Four times a day (QID) | ORAL | Status: DC | PRN

## 2013-04-14 MED ORDER — ENOXAPARIN SODIUM 40 MG/0.4ML ~~LOC~~ SOLN
40.0000 mg | SUBCUTANEOUS | Status: DC
  Administered 2013-04-14: 40 mg via SUBCUTANEOUS
  Filled 2013-04-14: qty 0.4

## 2013-04-14 MED ORDER — SODIUM CHLORIDE 0.9 % IV SOLN
INTRAVENOUS | Status: DC
  Administered 2013-04-14 – 2013-04-15 (×3): via INTRAVENOUS

## 2013-04-14 NOTE — Progress Notes (Deleted)
Patient seen and examined. He was as above per Clydie Braun black.  Patient is admitted with syncope. Per history, it appears to be vasovagal. He'll be monitored on telemetry, cycle cardiac markers and check d-dimer. He appears to be somewhat clinically dehydrated and we will continue him on IV fluids. Check echocardiogram in the morning. Urinalysis, urine drug screen, CT head and chest x-ray been unremarkable. He does have some mild elevation in his liver enzymes, likely due to alcohol. Lipase is normal. His epigastric pain is likely due to gastritis from alcohol. We'll start him on a proton pump inhibitor. Anticipate discharge home tomorrow.  Arthur Johnson
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
## 2013-04-15 DIAGNOSIS — I519 Heart disease, unspecified: Secondary | ICD-10-CM

## 2013-04-15 DIAGNOSIS — E871 Hypo-osmolality and hyponatremia: Secondary | ICD-10-CM | POA: Diagnosis not present

## 2013-04-15 LAB — CBC
HCT: 44.2 % (ref 39.0–52.0)
MCH: 33.8 pg (ref 26.0–34.0)
MCHC: 34.4 g/dL (ref 30.0–36.0)
MCV: 98.2 fL (ref 78.0–100.0)
Platelets: 197 10*3/uL (ref 150–400)
RBC: 4.5 MIL/uL (ref 4.22–5.81)

## 2013-04-15 LAB — COMPREHENSIVE METABOLIC PANEL
ALT: 88 U/L — ABNORMAL HIGH (ref 0–53)
AST: 59 U/L — ABNORMAL HIGH (ref 0–37)
Albumin: 2.9 g/dL — ABNORMAL LOW (ref 3.5–5.2)
CO2: 23 mEq/L (ref 19–32)
Chloride: 99 mEq/L (ref 96–112)
Creatinine, Ser: 0.6 mg/dL (ref 0.50–1.35)
GFR calc non Af Amer: >90 mL/min (ref >90)
Potassium: 4 mEq/L (ref 3.5–5.1)
Sodium: 133 mEq/L — ABNORMAL LOW (ref 135–145)
Total Bilirubin: 0.3 mg/dL (ref 0.3–1.2)
Total Protein: 5.9 g/dL — ABNORMAL LOW (ref 6.0–8.3)

## 2013-04-15 MED ORDER — PANTOPRAZOLE SODIUM 40 MG PO TBEC: 40.0000 mg | DELAYED_RELEASE_TABLET | Freq: Every day | ORAL | Status: DC

## 2013-04-15 NOTE — Discharge Summary (Signed)
Physician Discharge Summary  Arthur Johnson:[id number] DOB: 06/13/1966 DOA: 04/14/2013  PCP: Colette Ribas, MD  Admit date: 04/14/2013 Discharge date: 04/15/2013  Time spent: 40 minutes  Recommendations for Outpatient Follow-up:  PCP 1 week for evaluation of symptoms Discharge Diagnoses:  Principal Problem:   Syncope and collapse Active Problems:   Tobacco abuse   Hyperkalemia   Low back pain   HTN (hypertension)   ETOH abuse   Elevated transaminase level   Hyponatremia   Discharge Condition: stable  Diet recommendation: regular  Filed Weights   04/14/13 1021 04/15/13 0700  Weight: 77.111 kg (170 lb) 77.021 kg (169 lb 12.8 oz)    History of present illness:  Arthur Johnson is a 47 y.o. male past medical history that includes chronic low back pain tobacco use EtOH use presented to the emergency department on 04/14/13 after a syncopal event. Patient indicated he awakened in morning in his usual state of health. He did notice on occasion that when he stands up from a sitting position he has brief episodes of dizziness that usually pass quickly. He drove over to his ex-wife's house and had a cup of coffee. He stateed he remembers standing up from the table and beginning to walk across the room and he knew immediately "the blackness was coming". He reported that it was much like those dizzy spells he has when he stands up except "1000 times worse". He denied any injury. Wife reported associated symptoms include diaphoresis. He remembered waking up on the floor and EMS coming. He denied any recent illness fever chills sick contacts. He denied any chest pain palpitation headache visual disturbances. He denied dysuria hematuria frequency or urgency. He denied abdominal pain diarrhea or constipation. He did indicate some numbness and tingling in his lower extremities particularly his left foot as a result of back surgery. He stated a history of GERD as well. He also  indicated that it his history to eat once a day Workup in the emergency room significant for a potassium level 5.7 and serum glucose of 117 AST 127 ALT 132, CT of the head yielded no acute abnormality, EKG yielded sinus tachycardia with septal infarct noted in 2012, chest x-ray without any acute abnormalities. In addition patient reported history of EtOH abuse. He indicated that he drinks 6-12 beers daily. He denied any history of withdrawal symptoms or seizure activity.    Hospital course Syncope and collapse: Likely related to vasovagal. Admitted to telemetry and vigorously hydrated with IV fluids. CT of head and chest xray unremarkable.  Troponin negative x3 and  2-D echo yields EF 60% with grade 1 diastolic dysfunction.  U/A and UDS unremarkable. Not orthostatics on admission or on day of discharge. Follow up with PCP for evaluation of symptoms.  Active Problems:   Hyperkalemia: Mild on admission. Resolved at discharge.    Low back pain: Remained stable at baseline.   HTN (hypertension): Patient not on any hypertensives. Blood pressure borderline on admission. At discharge SBP range 126-139.   Elevated transaminases: Likely related to use of EtOH. Reported drinking 6-12 beers daily. He also reported frequent intermittent upper left quadrant pain.Lipase within the limits of normal. Recommended to avoid ETOH   ETOH abuse: Patient reported drinking 6-12 beers. No signs withdrawal during hospitalization   Tobacco abuse: Cessation counseling.    Procedures:  none  Consultations:  none  Discharge Exam: Filed Vitals:   04/15/13 0910  BP: 130/85  Pulse: 66  Temp:  General: well nourished NAD Cardiovascular: RRR No MGR No Respiratory: normal effort BS clear bilaterally no wheeze no rhonchi  Discharge Instructions     Medication List         pantoprazole 40 MG tablet  Commonly known as:  PROTONIX  Take 1 tablet (40 mg total) by mouth daily.       No Known  Allergies    The results of significant diagnostics from this hospitalization (including imaging, microbiology, ancillary and laboratory) are listed below for reference.    Significant Diagnostic Studies: Dg Chest 2 View  04/14/2013   CLINICAL DATA:  Syncope, seasonal cough  EXAM: CHEST  2 VIEW  COMPARISON:  02/13/2011  FINDINGS: Lungs are grossly clear. No focal consolidation. No pleural effusion or pneumothorax.  The heart is normal in size.  Mild degenerative changes of the visualized thoracolumbar spine.  Shrapnel overlying the left chest wall/axilla.  IMPRESSION: No evidence of acute cardiopulmonary disease.   Electronically Signed   By: Charline Bills M.D.   On: 04/14/2013 12:20   Ct Head Wo Contrast  04/14/2013   CLINICAL DATA:  Syncopal episode  EXAM: CT HEAD WITHOUT CONTRAST  TECHNIQUE: Contiguous axial images were obtained from the base of the skull through the vertex without intravenous contrast.  COMPARISON:  None.  FINDINGS: The bony calvarium is intact. No gross soft tissue abnormality is noted. The ventricles are within normal limits for the patient's age. No findings to suggest acute hemorrhage, acute infarction or space-occupying mass lesion are noted.  IMPRESSION: No acute abnormality noted.   Electronically Signed   By: Alcide Clever M.D.   On: 04/14/2013 12:47    Microbiology: No results found for this or any previous visit (from the past 240 hour(s)).   Labs: Basic Metabolic Panel:  Recent Labs Lab 04/14/13 1104 04/14/13 1316 04/15/13 0529  NA 137  --  133*  K 5.7* 4.3 4.0  CL 101  --  99  CO2 26  --  23  GLUCOSE 117*  --  100*  BUN 7  --  8  CREATININE 0.78  --  0.60  CALCIUM 9.4  --  8.5   Liver Function Tests:  Recent Labs Lab 04/14/13 1104 04/15/13 0529  AST 127* 59*  ALT 132* 88*  ALKPHOS 66 57  BILITOT 0.4 0.3  PROT 7.2 5.9*  ALBUMIN 3.6 2.9*    Recent Labs Lab 04/14/13 1104  LIPASE 30   No results found for this basename: AMMONIA,   in the last 168 hours CBC:  Recent Labs Lab 04/14/13 1104 04/15/13 0529  WBC 8.2 6.9  NEUTROABS 6.1  --   HGB 17.3* 15.2  HCT 49.0 44.2  MCV 99.2 98.2  PLT 232 197   Cardiac Enzymes:  Recent Labs Lab 04/14/13 1104 04/14/13 1331 04/14/13 1733 04/14/13 2253  CKTOTAL  --  55  --   --   TROPONINI <0.30  --  <0.30 <0.30   BNP: BNP (last 3 results) No results found for this basename: PROBNP,  in the last 8760 hours CBG: No results found for this basename: GLUCAP,  in the last 168 hours     Signed:  Gwenyth Bender  [facility] 04/15/2013, 1:11 PM

## 2013-04-15 NOTE — Progress Notes (Signed)
*  PRELIMINARY RESULTS* Echocardiogram 2D Echocardiogram has been performed.  Arthur Johnson 04/15/2013, 10:15 AM 

## 2013-04-15 NOTE — Progress Notes (Signed)
UR Chart Review Completed  

## 2013-04-15 NOTE — Discharge Summary (Signed)
Patient seen and examined, and agree with not as above per Toya Smothers, NP.  Patient is admitted with syncope. He was monitored on telemetry overnight. Cardiac enzymes, d-dimer was found to be negative. 2-D echocardiogram was also unremarkable. Per history, appears that this was a vasovagal event. No further workup is necessary at this time. Patient is adequately hydrated with IV fluids. He is ready for discharge home.  MEMON,JEHANZEB

## 2013-04-15 NOTE — Progress Notes (Signed)
Patient discharged with instructions given on medications and follow up visits,patient verbalized understanding.Prescriptions sent with patient. Accompanied by staff to an awaiting vehicle. No c/o pain or discomfort noted.

## 2013-04-22 ENCOUNTER — Emergency Department (HOSPITAL_COMMUNITY)
Admission: EM | Admit: 2013-04-22 | Discharge: 2013-04-22 | Disposition: A | Discharge status: Home / Self Care | Payer: Medicaid Other | Attending: Emergency Medicine | Admitting: Emergency Medicine

## 2013-04-22 ENCOUNTER — Encounter (HOSPITAL_COMMUNITY): Payer: Self-pay | Admitting: Emergency Medicine

## 2013-04-22 DIAGNOSIS — Z8719 Personal history of other diseases of the digestive system: Secondary | ICD-10-CM | POA: Insufficient documentation

## 2013-04-22 DIAGNOSIS — I951 Orthostatic hypotension: Secondary | ICD-10-CM

## 2013-04-22 DIAGNOSIS — Z87828 Personal history of other (healed) physical injury and trauma: Secondary | ICD-10-CM | POA: Insufficient documentation

## 2013-04-22 DIAGNOSIS — R51 Headache: Secondary | ICD-10-CM | POA: Insufficient documentation

## 2013-04-22 DIAGNOSIS — R110 Nausea: Secondary | ICD-10-CM | POA: Insufficient documentation

## 2013-04-22 DIAGNOSIS — F17200 Nicotine dependence, unspecified, uncomplicated: Secondary | ICD-10-CM | POA: Insufficient documentation

## 2013-04-22 LAB — ETHANOL: Alcohol, Ethyl (B): <11 mg/dL (ref 0–11)

## 2013-04-22 LAB — CBC WITH DIFFERENTIAL/PLATELET
Basophils Absolute: 0 10*3/uL (ref 0.0–0.1)
Eosinophils Absolute: 0 10*3/uL (ref 0.0–0.7)
Eosinophils Relative: 1 % (ref 0–5)
HCT: 49.3 % (ref 39.0–52.0)
Lymphocytes Relative: 13 % (ref 12–46)
MCH: 35.6 pg — ABNORMAL HIGH (ref 26.0–34.0)
MCHC: 36.7 g/dL — ABNORMAL HIGH (ref 30.0–36.0)
MCV: 96.9 fL (ref 78.0–100.0)
Monocytes Absolute: 0.9 10*3/uL (ref 0.1–1.0)
Neutro Abs: 6.3 10*3/uL (ref 1.7–7.7)
Platelets: 293 10*3/uL (ref 150–400)
RDW: 12.6 % (ref 11.5–15.5)
WBC: 8.4 10*3/uL (ref 4.0–10.5)

## 2013-04-22 LAB — COMPREHENSIVE METABOLIC PANEL
AST: 97 U/L — ABNORMAL HIGH (ref 0–37)
BUN: 5 mg/dL — ABNORMAL LOW (ref 6–23)
CO2: 23 mEq/L (ref 19–32)
Calcium: 9.6 mg/dL (ref 8.4–10.5)
Chloride: 97 mEq/L (ref 96–112)
Creatinine, Ser: 0.61 mg/dL (ref 0.50–1.35)
GFR calc Af Amer: >90 mL/min (ref >90)
GFR calc non Af Amer: >90 mL/min (ref >90)
Total Bilirubin: 0.5 mg/dL (ref 0.3–1.2)

## 2013-04-22 LAB — RAPID URINE DRUG SCREEN, HOSP PERFORMED
Barbiturates: NOT DETECTED
Opiates: NOT DETECTED

## 2013-04-22 LAB — URINALYSIS, ROUTINE W REFLEX MICROSCOPIC
Bilirubin Urine: NEGATIVE
Glucose, UA: NEGATIVE mg/dL
Ketones, ur: NEGATIVE mg/dL
Leukocytes, UA: NEGATIVE
Nitrite: NEGATIVE
Protein, ur: NEGATIVE mg/dL
Urobilinogen, UA: 0.2 mg/dL (ref 0.0–1.0)

## 2013-04-22 MED ORDER — SODIUM CHLORIDE 0.9 % IV BOLUS (SEPSIS)
1000.0000 mL | Freq: Once | INTRAVENOUS | Status: AC
  Administered 2013-04-22: 1000 mL via INTRAVENOUS

## 2013-04-22 MED ORDER — LORAZEPAM 2 MG/ML IJ SOLN
1.0000 mg | Freq: Once | INTRAMUSCULAR | Status: AC
  Administered 2013-04-22: 1 mg via INTRAVENOUS
  Filled 2013-04-22: qty 1

## 2013-04-22 MED ORDER — ONDANSETRON HCL 4 MG PO TABS: 4.0000 mg | ORAL_TABLET | Freq: Four times a day (QID) | ORAL | Status: DC
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
## 2013-04-22 NOTE — ED Notes (Signed)
Patient stated that when he was ambulated his muscles "felt sore from the shaking".  Patient denies any feeling of weakness.

## 2013-04-22 NOTE — Progress Notes (Signed)
CM noted pt did not have insurance recorded in the computer. Per spouse they applied for Medicaid after the previous admission, and were told he would qualify and would be covered for 6 months, but have not gotten the official notification, nor a card yet. They were given the name and phone number of the financial counselor, B. Ratliff, for discussing any problem with bills, until they get the card. They expressed appreciation for this.

## 2013-04-22 NOTE — ED Notes (Signed)
Pt alert & oriented x4, stable gait. Patient given discharge instructions, paperwork & prescription(s). Patient  instructed to stop at the registration desk to finish any additional paperwork. Patient verbalized understanding. Pt left department w/ no further questions. 

## 2013-04-22 NOTE — ED Notes (Signed)
Pt states he was fine when he got up around 0800 this morning. States he then had a sudden onset of dizziness, head pressure, weakness, nausea and shaking. States he was hospitalized recently with the same problem
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
## 2017-04-21 ENCOUNTER — Emergency Department (HOSPITAL_COMMUNITY): Payer: Medicare Other

## 2017-04-21 ENCOUNTER — Encounter (HOSPITAL_COMMUNITY): Payer: Self-pay | Admitting: *Deleted

## 2017-04-21 ENCOUNTER — Emergency Department (HOSPITAL_COMMUNITY)
Admission: EM | Admit: 2017-04-21 | Discharge: 2017-04-22 | Disposition: A | Discharge status: Home / Self Care | Payer: Medicare Other | Attending: Emergency Medicine | Admitting: Emergency Medicine

## 2017-04-21 DIAGNOSIS — F17210 Nicotine dependence, cigarettes, uncomplicated: Secondary | ICD-10-CM | POA: Insufficient documentation

## 2017-04-21 DIAGNOSIS — I10 Essential (primary) hypertension: Secondary | ICD-10-CM | POA: Insufficient documentation

## 2017-04-21 DIAGNOSIS — J441 Chronic obstructive pulmonary disease with (acute) exacerbation: Secondary | ICD-10-CM | POA: Insufficient documentation

## 2017-04-21 DIAGNOSIS — R55 Syncope and collapse: Secondary | ICD-10-CM | POA: Diagnosis present

## 2017-04-21 LAB — COMPREHENSIVE METABOLIC PANEL
ALBUMIN: 3 g/dL — AB (ref 3.5–5.0)
ALK PHOS: 162 U/L — AB (ref 38–126)
ALT: 43 U/L (ref 17–63)
ANION GAP: 19 — AB (ref 5–15)
AST: 138 U/L — AB (ref 15–41)
CHLORIDE: 96 mmol/L — AB (ref 101–111)
CO2: 22 mmol/L (ref 22–32)
Calcium: 8.6 mg/dL — ABNORMAL LOW (ref 8.9–10.3)
Creatinine, Ser: 0.51 mg/dL — ABNORMAL LOW (ref 0.61–1.24)
GFR calc Af Amer: >60 mL/min (ref >60)
GFR calc non Af Amer: >60 mL/min (ref >60)
GLUCOSE: 134 mg/dL — AB (ref 65–99)
POTASSIUM: 3.3 mmol/L — AB (ref 3.5–5.1)
Sodium: 137 mmol/L (ref 135–145)
Total Bilirubin: 2.6 mg/dL — ABNORMAL HIGH (ref 0.3–1.2)
Total Protein: 7.3 g/dL (ref 6.5–8.1)

## 2017-04-21 LAB — CBC WITH DIFFERENTIAL/PLATELET
Basophils Absolute: 0 10*3/uL (ref 0.0–0.1)
Basophils Relative: 0 %
Eosinophils Absolute: 0 10*3/uL (ref 0.0–0.7)
Eosinophils Relative: 0 %
HEMATOCRIT: 42.3 % (ref 39.0–52.0)
HEMOGLOBIN: 14.8 g/dL (ref 13.0–17.0)
LYMPHS ABS: 1 10*3/uL (ref 0.7–4.0)
LYMPHS PCT: 13 %
MCH: 35.2 pg — ABNORMAL HIGH (ref 26.0–34.0)
MCHC: 35 g/dL (ref 30.0–36.0)
MCV: 100.5 fL — ABNORMAL HIGH (ref 78.0–100.0)
Monocytes Absolute: 0.7 10*3/uL (ref 0.1–1.0)
Monocytes Relative: 10 %
NEUTROS PCT: 77 %
Neutro Abs: 5.6 10*3/uL (ref 1.7–7.7)
Platelets: 98 10*3/uL — ABNORMAL LOW (ref 150–400)
RBC: 4.21 MIL/uL — ABNORMAL LOW (ref 4.22–5.81)
RDW: 13.9 % (ref 11.5–15.5)
WBC: 7.3 10*3/uL (ref 4.0–10.5)

## 2017-04-21 LAB — TROPONIN I: Troponin I: <0.03 ng/mL (ref <0.03)

## 2017-04-21 LAB — D-DIMER, QUANTITATIVE: D-Dimer, Quant: 1.82 ug/mL-FEU — ABNORMAL HIGH (ref 0.00–0.50)

## 2017-04-21 MED ORDER — IOPAMIDOL (ISOVUE-370) INJECTION 76%
100.0000 mL | Freq: Once | INTRAVENOUS | Status: AC | PRN
  Administered 2017-04-21: 100 mL via INTRAVENOUS

## 2017-04-21 MED ORDER — PREDNISONE 20 MG PO TABS: 60.0000 mg | ORAL_TABLET | Freq: Every day | ORAL | 0 refills | Status: DC

## 2017-04-21 MED ORDER — IPRATROPIUM-ALBUTEROL 0.5-2.5 (3) MG/3ML IN SOLN: 3.0000 mL | Freq: Once | RESPIRATORY_TRACT | Status: DC

## 2017-04-21 MED ORDER — SODIUM CHLORIDE 0.9 % IV BOLUS (SEPSIS)
500.0000 mL | Freq: Once | INTRAVENOUS | Status: AC
  Administered 2017-04-21: 500 mL via INTRAVENOUS

## 2017-04-21 MED ORDER — ALBUTEROL SULFATE (2.5 MG/3ML) 0.083% IN NEBU
2.5000 mg | INHALATION_SOLUTION | Freq: Once | RESPIRATORY_TRACT | Status: AC
  Administered 2017-04-21: 2.5 mg via RESPIRATORY_TRACT
  Filled 2017-04-21: qty 3

## 2017-04-21 MED ORDER — PREDNISONE 50 MG PO TABS
60.0000 mg | ORAL_TABLET | Freq: Once | ORAL | Status: AC
  Administered 2017-04-21: 60 mg via ORAL
  Filled 2017-04-21: qty 1

## 2017-04-21 MED ORDER — IPRATROPIUM-ALBUTEROL 0.5-2.5 (3) MG/3ML IN SOLN
3.0000 mL | Freq: Once | RESPIRATORY_TRACT | Status: AC
  Administered 2017-04-21: 3 mL via RESPIRATORY_TRACT
  Filled 2017-04-21: qty 3

## 2017-04-21 NOTE — ED Triage Notes (Signed)
Pt passed out while sitting in chair for few minutes right before pt got here.  Per pt felt fine up to that point, c/o chronic pain to legs and back.
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
## 2017-04-21 NOTE — Discharge Instructions (Signed)
Continue your albuterol inhaler.  Take the steroids as prescribed.  Follow-up with your primary care doctor next week to make sure you are improving.  Return as needed for worsening or recurrent symptoms

## 2017-05-08 ENCOUNTER — Emergency Department (HOSPITAL_COMMUNITY): Payer: Medicare Other

## 2017-05-08 ENCOUNTER — Inpatient Hospital Stay (HOSPITAL_COMMUNITY): Payer: Medicare Other

## 2017-05-08 ENCOUNTER — Encounter (HOSPITAL_COMMUNITY): Payer: Self-pay | Admitting: *Deleted

## 2017-05-08 ENCOUNTER — Other Ambulatory Visit: Payer: Self-pay

## 2017-05-08 ENCOUNTER — Inpatient Hospital Stay (HOSPITAL_COMMUNITY)
Admission: EM | Admit: 2017-05-08 | Discharge: 2017-05-15 | DRG: 377 | Disposition: A | Discharge status: Home / Self Care | Payer: Medicare Other | Attending: Family Medicine | Admitting: Family Medicine

## 2017-05-08 DIAGNOSIS — K921 Melena: Secondary | ICD-10-CM | POA: Diagnosis not present

## 2017-05-08 DIAGNOSIS — E876 Hypokalemia: Secondary | ICD-10-CM | POA: Diagnosis present

## 2017-05-08 DIAGNOSIS — K5731 Diverticulosis of large intestine without perforation or abscess with bleeding: Principal | ICD-10-CM | POA: Diagnosis present

## 2017-05-08 DIAGNOSIS — K922 Gastrointestinal hemorrhage, unspecified: Secondary | ICD-10-CM | POA: Diagnosis present

## 2017-05-08 DIAGNOSIS — M549 Dorsalgia, unspecified: Secondary | ICD-10-CM | POA: Diagnosis present

## 2017-05-08 DIAGNOSIS — K621 Rectal polyp: Secondary | ICD-10-CM | POA: Diagnosis present

## 2017-05-08 DIAGNOSIS — K644 Residual hemorrhoidal skin tags: Secondary | ICD-10-CM | POA: Diagnosis present

## 2017-05-08 DIAGNOSIS — R609 Edema, unspecified: Secondary | ICD-10-CM | POA: Diagnosis present

## 2017-05-08 DIAGNOSIS — E871 Hypo-osmolality and hyponatremia: Secondary | ICD-10-CM | POA: Diagnosis present

## 2017-05-08 DIAGNOSIS — Z79899 Other long term (current) drug therapy: Secondary | ICD-10-CM | POA: Diagnosis not present

## 2017-05-08 DIAGNOSIS — K7031 Alcoholic cirrhosis of liver with ascites: Secondary | ICD-10-CM | POA: Diagnosis present

## 2017-05-08 DIAGNOSIS — F10230 Alcohol dependence with withdrawal, uncomplicated: Secondary | ICD-10-CM | POA: Diagnosis not present

## 2017-05-08 DIAGNOSIS — F17210 Nicotine dependence, cigarettes, uncomplicated: Secondary | ICD-10-CM | POA: Diagnosis present

## 2017-05-08 DIAGNOSIS — K3189 Other diseases of stomach and duodenum: Secondary | ICD-10-CM | POA: Diagnosis present

## 2017-05-08 DIAGNOSIS — D124 Benign neoplasm of descending colon: Secondary | ICD-10-CM | POA: Diagnosis present

## 2017-05-08 DIAGNOSIS — K648 Other hemorrhoids: Secondary | ICD-10-CM | POA: Diagnosis present

## 2017-05-08 DIAGNOSIS — K766 Portal hypertension: Secondary | ICD-10-CM | POA: Diagnosis present

## 2017-05-08 DIAGNOSIS — J440 Chronic obstructive pulmonary disease with acute lower respiratory infection: Secondary | ICD-10-CM | POA: Diagnosis present

## 2017-05-08 DIAGNOSIS — I10 Essential (primary) hypertension: Secondary | ICD-10-CM | POA: Diagnosis present

## 2017-05-08 DIAGNOSIS — D62 Acute posthemorrhagic anemia: Secondary | ICD-10-CM | POA: Diagnosis present

## 2017-05-08 DIAGNOSIS — F1010 Alcohol abuse, uncomplicated: Secondary | ICD-10-CM | POA: Diagnosis not present

## 2017-05-08 DIAGNOSIS — G8929 Other chronic pain: Secondary | ICD-10-CM | POA: Diagnosis present

## 2017-05-08 DIAGNOSIS — G629 Polyneuropathy, unspecified: Secondary | ICD-10-CM | POA: Diagnosis present

## 2017-05-08 DIAGNOSIS — T189XXA Foreign body of alimentary tract, part unspecified, initial encounter: Secondary | ICD-10-CM

## 2017-05-08 DIAGNOSIS — K219 Gastro-esophageal reflux disease without esophagitis: Secondary | ICD-10-CM | POA: Diagnosis present

## 2017-05-08 DIAGNOSIS — Z720 Tobacco use: Secondary | ICD-10-CM

## 2017-05-08 DIAGNOSIS — D638 Anemia in other chronic diseases classified elsewhere: Secondary | ICD-10-CM | POA: Diagnosis present

## 2017-05-08 DIAGNOSIS — I864 Gastric varices: Secondary | ICD-10-CM | POA: Diagnosis present

## 2017-05-08 DIAGNOSIS — D696 Thrombocytopenia, unspecified: Secondary | ICD-10-CM | POA: Diagnosis present

## 2017-05-08 DIAGNOSIS — I351 Nonrheumatic aortic (valve) insufficiency: Secondary | ICD-10-CM | POA: Diagnosis not present

## 2017-05-08 DIAGNOSIS — R233 Spontaneous ecchymoses: Secondary | ICD-10-CM | POA: Diagnosis present

## 2017-05-08 DIAGNOSIS — J189 Pneumonia, unspecified organism: Secondary | ICD-10-CM | POA: Diagnosis present

## 2017-05-08 DIAGNOSIS — F10939 Alcohol use, unspecified with withdrawal, unspecified: Secondary | ICD-10-CM

## 2017-05-08 DIAGNOSIS — K254 Chronic or unspecified gastric ulcer with hemorrhage: Secondary | ICD-10-CM | POA: Diagnosis not present

## 2017-05-08 DIAGNOSIS — F10239 Alcohol dependence with withdrawal, unspecified: Secondary | ICD-10-CM | POA: Diagnosis present

## 2017-05-08 DIAGNOSIS — K449 Diaphragmatic hernia without obstruction or gangrene: Secondary | ICD-10-CM | POA: Diagnosis present

## 2017-05-08 DIAGNOSIS — Z792 Long term (current) use of antibiotics: Secondary | ICD-10-CM

## 2017-05-08 HISTORY — DX: Other chronic pain: G89.29

## 2017-05-08 HISTORY — DX: Syncope and collapse: R55

## 2017-05-08 HISTORY — DX: Chronic obstructive pulmonary disease, unspecified: J44.9

## 2017-05-08 HISTORY — DX: Essential (primary) hypertension: I10

## 2017-05-08 HISTORY — DX: Dorsalgia, unspecified: M54.9

## 2017-05-08 LAB — PROTIME-INR
INR: 1.16
PROTHROMBIN TIME: 14.7 s (ref 11.4–15.2)

## 2017-05-08 LAB — LIPASE, BLOOD: LIPASE: 310 U/L — AB (ref 11–51)

## 2017-05-08 LAB — COMPREHENSIVE METABOLIC PANEL
ALT: 52 U/L (ref 17–63)
ANION GAP: 13 (ref 5–15)
AST: 106 U/L — ABNORMAL HIGH (ref 15–41)
Albumin: 2.5 g/dL — ABNORMAL LOW (ref 3.5–5.0)
Alkaline Phosphatase: 97 U/L (ref 38–126)
BUN: 14 mg/dL (ref 6–20)
CHLORIDE: 97 mmol/L — AB (ref 101–111)
CO2: 21 mmol/L — AB (ref 22–32)
Calcium: 7.6 mg/dL — ABNORMAL LOW (ref 8.9–10.3)
Creatinine, Ser: 0.42 mg/dL — ABNORMAL LOW (ref 0.61–1.24)
Glucose, Bld: 148 mg/dL — ABNORMAL HIGH (ref 65–99)
POTASSIUM: 3.3 mmol/L — AB (ref 3.5–5.1)
SODIUM: 131 mmol/L — AB (ref 135–145)
Total Bilirubin: 1.8 mg/dL — ABNORMAL HIGH (ref 0.3–1.2)
Total Protein: 5.5 g/dL — ABNORMAL LOW (ref 6.5–8.1)

## 2017-05-08 LAB — CBC WITH DIFFERENTIAL/PLATELET
BASOS ABS: 0.1 10*3/uL (ref 0.0–0.1)
Basophils Relative: 0 %
Eosinophils Absolute: 0 10*3/uL (ref 0.0–0.7)
Eosinophils Relative: 0 %
HCT: 31.1 % — ABNORMAL LOW (ref 39.0–52.0)
HEMOGLOBIN: 10.8 g/dL — AB (ref 13.0–17.0)
LYMPHS ABS: 2.6 10*3/uL (ref 0.7–4.0)
LYMPHS PCT: 19 %
MCH: 34.8 pg — AB (ref 26.0–34.0)
MCHC: 34.7 g/dL (ref 30.0–36.0)
MCV: 100.3 fL — AB (ref 78.0–100.0)
Monocytes Absolute: 1.4 10*3/uL — ABNORMAL HIGH (ref 0.1–1.0)
Monocytes Relative: 10 %
NEUTROS PCT: 70 %
Neutro Abs: 9.4 10*3/uL — ABNORMAL HIGH (ref 1.7–7.7)
Platelets: 159 10*3/uL (ref 150–400)
RBC: 3.1 MIL/uL — AB (ref 4.22–5.81)
RDW: 13.8 % (ref 11.5–15.5)
WBC: 13.4 10*3/uL — AB (ref 4.0–10.5)

## 2017-05-08 LAB — RAPID URINE DRUG SCREEN, HOSP PERFORMED
Amphetamines: NOT DETECTED
BARBITURATES: NOT DETECTED
BENZODIAZEPINES: NOT DETECTED
COCAINE: NOT DETECTED
Opiates: NOT DETECTED
Tetrahydrocannabinol: NOT DETECTED

## 2017-05-08 LAB — CBC
HEMATOCRIT: 26.3 % — AB (ref 39.0–52.0)
HEMOGLOBIN: 9 g/dL — AB (ref 13.0–17.0)
MCH: 34.6 pg — ABNORMAL HIGH (ref 26.0–34.0)
MCHC: 34.2 g/dL (ref 30.0–36.0)
MCV: 101.2 fL — AB (ref 78.0–100.0)
Platelets: 134 10*3/uL — ABNORMAL LOW (ref 150–400)
RBC: 2.6 MIL/uL — ABNORMAL LOW (ref 4.22–5.81)
RDW: 13.6 % (ref 11.5–15.5)
WBC: 12.7 10*3/uL — ABNORMAL HIGH (ref 4.0–10.5)

## 2017-05-08 LAB — POC OCCULT BLOOD, ED: FECAL OCCULT BLD: POSITIVE — AB

## 2017-05-08 MED ORDER — THIAMINE HCL 100 MG/ML IJ SOLN
INTRAMUSCULAR | Status: AC
  Filled 2017-05-08: qty 2

## 2017-05-08 MED ORDER — ALBUTEROL SULFATE (2.5 MG/3ML) 0.083% IN NEBU
3.0000 mL | INHALATION_SOLUTION | Freq: Four times a day (QID) | RESPIRATORY_TRACT | Status: DC | PRN
  Administered 2017-05-09: 3 mL via RESPIRATORY_TRACT
  Filled 2017-05-08: qty 3

## 2017-05-08 MED ORDER — M.V.I. ADULT IV INJ
INJECTION | INTRAVENOUS | Status: AC
  Filled 2017-05-08: qty 10

## 2017-05-08 MED ORDER — THIAMINE HCL 100 MG/ML IJ SOLN
Freq: Once | INTRAVENOUS | Status: AC
  Administered 2017-05-08: 22:00:00 via INTRAVENOUS
  Filled 2017-05-08: qty 1000

## 2017-05-08 MED ORDER — SODIUM CHLORIDE 0.9 % IV SOLN
50.0000 ug/h | INTRAVENOUS | Status: DC
  Administered 2017-05-08 – 2017-05-09 (×3): 50 ug/h via INTRAVENOUS
  Filled 2017-05-08 (×8): qty 1

## 2017-05-08 MED ORDER — ACETAMINOPHEN 325 MG PO TABS: 650.0000 mg | ORAL_TABLET | Freq: Four times a day (QID) | ORAL | Status: DC | PRN

## 2017-05-08 MED ORDER — LACTATED RINGERS IV SOLN: INTRAVENOUS | Status: DC

## 2017-05-08 MED ORDER — PANTOPRAZOLE SODIUM 40 MG IV SOLR
80.0000 mg | Freq: Once | INTRAVENOUS | Status: AC
  Administered 2017-05-08: 80 mg via INTRAVENOUS
  Filled 2017-05-08: qty 80

## 2017-05-08 MED ORDER — LORAZEPAM 1 MG PO TABS: 1.0000 mg | ORAL_TABLET | Freq: Four times a day (QID) | ORAL | Status: DC | PRN

## 2017-05-08 MED ORDER — LORAZEPAM 2 MG/ML IJ SOLN: 1.0000 mg | Freq: Four times a day (QID) | INTRAMUSCULAR | Status: DC | PRN

## 2017-05-08 MED ORDER — SODIUM CHLORIDE 0.9 % IV BOLUS (SEPSIS)
1000.0000 mL | Freq: Once | INTRAVENOUS | Status: AC
  Administered 2017-05-08: 1000 mL via INTRAVENOUS

## 2017-05-08 MED ORDER — ADULT MULTIVITAMIN W/MINERALS CH: 1.0000 | ORAL_TABLET | Freq: Every day | ORAL | Status: DC

## 2017-05-08 MED ORDER — PANTOPRAZOLE SODIUM 40 MG IV SOLR
40.0000 mg | Freq: Two times a day (BID) | INTRAVENOUS | Status: DC
  Administered 2017-05-09: 40 mg via INTRAVENOUS
  Filled 2017-05-08: qty 40

## 2017-05-08 MED ORDER — ACETAMINOPHEN 650 MG RE SUPP: 650.0000 mg | Freq: Four times a day (QID) | RECTAL | Status: DC | PRN

## 2017-05-08 MED ORDER — ONDANSETRON HCL 4 MG/2ML IJ SOLN
4.0000 mg | Freq: Four times a day (QID) | INTRAMUSCULAR | Status: DC | PRN
  Administered 2017-05-08 – 2017-05-10 (×2): 4 mg via INTRAVENOUS
  Filled 2017-05-08: qty 2

## 2017-05-08 MED ORDER — VITAMIN B-1 100 MG PO TABS: 100.0000 mg | ORAL_TABLET | Freq: Every day | ORAL | Status: DC

## 2017-05-08 MED ORDER — FOLIC ACID 1 MG PO TABS: 1.0000 mg | ORAL_TABLET | Freq: Every day | ORAL | Status: DC

## 2017-05-08 MED ORDER — THIAMINE HCL 100 MG/ML IJ SOLN: 100.0000 mg | Freq: Every day | INTRAMUSCULAR | Status: DC

## 2017-05-08 MED ORDER — IOPAMIDOL (ISOVUE-300) INJECTION 61%
100.0000 mL | Freq: Once | INTRAVENOUS | Status: AC | PRN
  Administered 2017-05-08: 100 mL via INTRAVENOUS

## 2017-05-08 MED ORDER — FOLIC ACID 5 MG/ML IJ SOLN
INTRAMUSCULAR | Status: AC
  Filled 2017-05-08: qty 0.2

## 2017-05-08 MED ORDER — SODIUM CHLORIDE 0.9 % IV SOLN
INTRAVENOUS | Status: DC
  Administered 2017-05-08 – 2017-05-10 (×4): via INTRAVENOUS

## 2017-05-08 MED ORDER — OCTREOTIDE LOAD VIA INFUSION
50.0000 ug | Freq: Once | INTRAVENOUS | Status: AC
  Administered 2017-05-08: 50 ug via INTRAVENOUS
  Filled 2017-05-08: qty 25

## 2017-05-08 MED ORDER — DEXTROSE 5 % IV SOLN
1.0000 g | INTRAVENOUS | Status: DC
  Administered 2017-05-08 – 2017-05-09 (×2): 1 g via INTRAVENOUS
  Filled 2017-05-08 (×4): qty 10

## 2017-05-08 MED ORDER — NADOLOL 40 MG PO TABS
40.0000 mg | ORAL_TABLET | Freq: Every day | ORAL | Status: DC
  Administered 2017-05-08 – 2017-05-10 (×3): 40 mg via ORAL
  Filled 2017-05-08 (×5): qty 1
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
## 2017-05-08 NOTE — ED Triage Notes (Addendum)
Pt's family member reports pt started having bright red bleeding this morning. Pt reports weakness, no dizziness. Denies abdominal pain. Reports nausea, no vomiting.

## 2017-05-08 NOTE — Progress Notes (Signed)
Pt c/o abdominal cramping. Requesting something for gas. Dr. Hilbert Bible paged and made aware. Waiting for call back/orders.
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
## 2017-05-09 ENCOUNTER — Encounter (HOSPITAL_COMMUNITY)
Admission: EM | Disposition: A | Discharge status: Home / Self Care | Payer: Self-pay | Source: Home / Self Care | Attending: Internal Medicine

## 2017-05-09 ENCOUNTER — Inpatient Hospital Stay (HOSPITAL_COMMUNITY): Payer: Medicare Other | Admitting: Anesthesiology

## 2017-05-09 ENCOUNTER — Encounter (HOSPITAL_COMMUNITY): Payer: Self-pay | Admitting: Anesthesiology

## 2017-05-09 DIAGNOSIS — K254 Chronic or unspecified gastric ulcer with hemorrhage: Secondary | ICD-10-CM

## 2017-05-09 DIAGNOSIS — K766 Portal hypertension: Secondary | ICD-10-CM

## 2017-05-09 DIAGNOSIS — D62 Acute posthemorrhagic anemia: Secondary | ICD-10-CM

## 2017-05-09 DIAGNOSIS — K3189 Other diseases of stomach and duodenum: Secondary | ICD-10-CM

## 2017-05-09 DIAGNOSIS — K922 Gastrointestinal hemorrhage, unspecified: Secondary | ICD-10-CM

## 2017-05-09 HISTORY — PX: ESOPHAGOGASTRODUODENOSCOPY (EGD) WITH PROPOFOL: SHX5813

## 2017-05-09 LAB — CBC
HCT: 22.5 % — ABNORMAL LOW (ref 39.0–52.0)
HCT: 28.6 % — ABNORMAL LOW (ref 39.0–52.0)
HEMATOCRIT: 30 % — AB (ref 39.0–52.0)
HEMOGLOBIN: 7.6 g/dL — AB (ref 13.0–17.0)
HEMOGLOBIN: 9.5 g/dL — AB (ref 13.0–17.0)
Hemoglobin: 9.9 g/dL — ABNORMAL LOW (ref 13.0–17.0)
MCH: 34.7 pg — AB (ref 26.0–34.0)
MCH: 32.9 pg (ref 26.0–34.0)
MCH: 32.9 pg (ref 26.0–34.0)
MCHC: 33.8 g/dL (ref 30.0–36.0)
MCHC: 33 g/dL (ref 30.0–36.0)
MCHC: 33.2 g/dL (ref 30.0–36.0)
MCV: 102.7 fL — AB (ref 78.0–100.0)
MCV: 99.7 fL (ref 78.0–100.0)
MCV: 99 fL (ref 78.0–100.0)
PLATELETS: 100 10*3/uL — AB (ref 150–400)
Platelets: 101 10*3/uL — ABNORMAL LOW (ref 150–400)
Platelets: 107 10*3/uL — ABNORMAL LOW (ref 150–400)
RBC: 2.19 MIL/uL — AB (ref 4.22–5.81)
RBC: 3.01 MIL/uL — ABNORMAL LOW (ref 4.22–5.81)
RBC: 2.89 MIL/uL — ABNORMAL LOW (ref 4.22–5.81)
RDW: 14.1 % (ref 11.5–15.5)
RDW: 16.7 % — AB (ref 11.5–15.5)
RDW: 16.8 % — AB (ref 11.5–15.5)
WBC: 9.8 10*3/uL (ref 4.0–10.5)
WBC: 9.4 10*3/uL (ref 4.0–10.5)
WBC: 10.4 10*3/uL (ref 4.0–10.5)

## 2017-05-09 LAB — BASIC METABOLIC PANEL
ANION GAP: 6 (ref 5–15)
BUN: 15 mg/dL (ref 6–20)
CALCIUM: 7.1 mg/dL — AB (ref 8.9–10.3)
CO2: 25 mmol/L (ref 22–32)
Chloride: 102 mmol/L (ref 101–111)
Creatinine, Ser: 0.51 mg/dL — ABNORMAL LOW (ref 0.61–1.24)
Glucose, Bld: 118 mg/dL — ABNORMAL HIGH (ref 65–99)
POTASSIUM: 3.5 mmol/L (ref 3.5–5.1)
SODIUM: 133 mmol/L — AB (ref 135–145)

## 2017-05-09 LAB — PREPARE RBC (CROSSMATCH)

## 2017-05-09 LAB — ABO/RH: ABO/RH(D): A POS

## 2017-05-09 SURGERY — ESOPHAGOGASTRODUODENOSCOPY (EGD) WITH PROPOFOL
Anesthesia: Monitor Anesthesia Care

## 2017-05-09 MED ORDER — FUROSEMIDE 10 MG/ML IJ SOLN
40.0000 mg | Freq: Once | INTRAMUSCULAR | Status: AC
  Administered 2017-05-09: 40 mg via INTRAVENOUS
  Filled 2017-05-09: qty 4

## 2017-05-09 MED ORDER — SIMETHICONE 80 MG PO CHEW
160.0000 mg | CHEWABLE_TABLET | Freq: Four times a day (QID) | ORAL | Status: DC | PRN
  Administered 2017-05-09: 160 mg via ORAL
  Filled 2017-05-09: qty 2

## 2017-05-09 MED ORDER — MIDAZOLAM HCL 5 MG/5ML IJ SOLN
INTRAMUSCULAR | Status: DC | PRN
  Administered 2017-05-09: 2 mg via INTRAVENOUS
  Administered 2017-05-09: 1 mg via INTRAVENOUS

## 2017-05-09 MED ORDER — PROPOFOL 500 MG/50ML IV EMUL
INTRAVENOUS | Status: DC | PRN
  Administered 2017-05-09: 100 ug/kg/min via INTRAVENOUS
  Administered 2017-05-09: 125 ug/kg/min via INTRAVENOUS

## 2017-05-09 MED ORDER — MIDAZOLAM HCL 2 MG/2ML IJ SOLN
INTRAMUSCULAR | Status: AC
  Filled 2017-05-09: qty 4

## 2017-05-09 MED ORDER — PEG 3350-KCL-NA BICARB-NACL 420 G PO SOLR
4000.0000 mL | Freq: Once | ORAL | Status: AC
Start: 2017-05-09 — End: 2017-05-09
  Administered 2017-05-09: 4000 mL via ORAL
  Filled 2017-05-09: qty 4000

## 2017-05-09 MED ORDER — SODIUM CHLORIDE 0.9 % IV SOLN: INTRAVENOUS | Status: DC

## 2017-05-09 MED ORDER — PROPOFOL 10 MG/ML IV BOLUS
INTRAVENOUS | Status: AC
  Filled 2017-05-09: qty 20

## 2017-05-09 MED ORDER — LIDOCAINE VISCOUS 2 % MT SOLN
OROMUCOSAL | Status: AC
  Filled 2017-05-09: qty 15

## 2017-05-09 MED ORDER — PANTOPRAZOLE SODIUM 40 MG IV SOLR
40.0000 mg | INTRAVENOUS | Status: DC
  Administered 2017-05-10: 40 mg via INTRAVENOUS
  Filled 2017-05-09: qty 40

## 2017-05-09 MED ORDER — LACTATED RINGERS IV SOLN: INTRAVENOUS | Status: DC | PRN

## 2017-05-09 MED ORDER — PROPOFOL 10 MG/ML IV BOLUS
INTRAVENOUS | Status: DC | PRN
  Administered 2017-05-09: 10 mg via INTRAVENOUS
  Administered 2017-05-09: 20 mg via INTRAVENOUS
  Administered 2017-05-09: 10 mg via INTRAVENOUS

## 2017-05-09 MED ORDER — LIDOCAINE VISCOUS 2 % MT SOLN
5.0000 mL | Freq: Two times a day (BID) | OROMUCOSAL | Status: AC
  Administered 2017-05-09 (×2): 5 mL via OROMUCOSAL
  Filled 2017-05-09: qty 15

## 2017-05-09 MED ORDER — GLYCOPYRROLATE 0.2 MG/ML IJ SOLN
INTRAMUSCULAR | Status: AC
  Filled 2017-05-09: qty 1

## 2017-05-09 MED ORDER — SODIUM CHLORIDE 0.9 % IV SOLN
Freq: Once | INTRAVENOUS | Status: AC
  Administered 2017-05-09: 05:00:00 via INTRAVENOUS

## 2017-05-09 NOTE — Progress Notes (Signed)
Late entry from 2030: Upon assessment of pt at beginning of shift pts legs were wrapped with kerlix and ace wrap. Pt stated that the doctor wrapped his legs d/t swelling. For skin assessment purposes, dressings removed. Swelling +2 and no sores. Pt stated MD was going to removed the dressings on 05/09/17, will keep off for now.  RN spoke with Dr. Darrick Meigs about pt pooping straight blood. Dr. Darrick Meigs stated to monitor pts vital signs and hemoglobin and if pts BP decreases or hgb decreases make MD aware and pt may have to transfer to step down. Will continue to monitor pt

## 2017-05-09 NOTE — Progress Notes (Signed)
RN paged Dr. Darrick Meigs and made him aware of pts hemoglobin 7.6 Waiting for orders/call back

## 2017-05-09 NOTE — Progress Notes (Signed)
Dr. Laural Golden made aware that pt had STAT GI consult, he stated he would see him in the am. Pt given Mylicon for gas pains. Will continue to monitor

## 2017-05-09 NOTE — Op Note (Signed)
[facility] Patient Name: Arthur Johnson Procedure Date: 05/09/2017 1:41 PM MRN: [id number] Date of Birth: April 26, 1966 Attending MD: Hildred Laser , MD CSN: [id number] Age: 51 Admit Type: Inpatient Procedure:                Upper GI endoscopy Indications:              Acute post hemorrhagic anemia, Melena Providers:                Hildred Laser, MD, Hinton Rao, RN, Aram Candela Referring MD:             Rande Lawman, MD Medicines:                Lidocaine spray, Propofol per Anesthesia Complications:            No immediate complications. Estimated Blood Loss:     Estimated blood loss: none. Procedure:                Pre-Anesthesia Assessment:                           - Prior to the procedure, a History and Physical                            was performed, and patient medications and                            allergies were reviewed. The patient's tolerance of                            previous anesthesia was also reviewed. The risks                            and benefits of the procedure and the sedation                            options and risks were discussed with the patient.                            All questions were answered, and informed consent                            was obtained. Prior Anticoagulants: The patient has                            taken no previous anticoagulant or antiplatelet                            agents. ASA Grade Assessment: III - A patient with                            severe systemic disease. After reviewing the risks                            and benefits, the patient was deemed in  satisfactory condition to undergo the procedure.                           After obtaining informed consent, the endoscope was                            passed under direct vision. Throughout the                            procedure, the patient's blood pressure, pulse, and                            oxygen  saturations were monitored continuously. The                            [id number] ([id number]) scope was introduced through the                            mouth, and advanced to the second part of duodenum.                            The upper GI endoscopy was accomplished without                            difficulty. The patient tolerated the procedure                            well. Scope In: 3:37:14 PM Scope Out: 3:44:00 PM Total Procedure Duration: 0 hours 6 minutes 46 seconds  Findings:      The examined esophagus was normal.      The Z-line was regular and was found 41 cm from the incisors.      A 2 cm hiatal hernia was present.      Mild portal hypertensive gastropathy was found in the gastric fundus and       in the gastric body.      Patchy mild mucosal changes characterized by congestion and erythema       were found in the duodenal bulb and in the second portion of the       duodenum. Impression:               - Normal esophagus.                           - Z-line regular, 41 cm from the incisors.                           - 2 cm hiatal hernia.                           - Portal hypertensive gastropathy.                           - Mucosal changes in the duodenum.                           -  No specimens collected.                           Comment: No bleeding lesion noted on EGD. Moderate Sedation:      Per Anesthesia Care Recommendation:           - Return patient to hospital ward for ongoing care.                           - Clear liquid diet today.                           - Continue present medications.                           - Perform a colonoscopy tomorrow. Procedure Code(s):        --- Professional ---                           [phone], Esophagogastroduodenoscopy, flexible,                            transoral; diagnostic, including collection of                            specimen(s) by brushing or washing, when performed                            (separate  procedure) Diagnosis Code(s):        --- Professional ---                           K44.9, Diaphragmatic hernia without obstruction or                            gangrene                           K76.6, Portal hypertension                           K31.89, Other diseases of stomach and duodenum                           D62, Acute posthemorrhagic anemia                           K92.1, Melena (includes Hematochezia) CPT copyright 2016 American [employer]. All rights reserved. The codes documented in this report are preliminary and upon coder review may  be revised to meet current compliance requirements. Hildred Laser, MD Hildred Laser, MD 05/09/2017 4:29:09 PM This report has been signed electronically. Number of Addenda: 0

## 2017-05-09 NOTE — Progress Notes (Addendum)
Patient c/o swelling to lips this am, at this point,and time patient was receiving blood transfusion which was near the end. Upon my assessment patient's lips did look puffy, as well as his eyes, Deberah Castle PA at the bedside. Orders received,and given. Dr Jerilee Hoh also notified. Will continue to monitor patient. Family at the bedside.

## 2017-05-09 NOTE — Transfer of Care (Signed)
Immediate Anesthesia Transfer of Care Note  Patient: Arthur Johnson  Procedure(s) Performed: ESOPHAGOGASTRODUODENOSCOPY (EGD) WITH PROPOFOL (N/A )  Patient Location: PACU  Anesthesia Type:MAC  Level of Consciousness: awake and alert   Airway & Oxygen Therapy: Patient Spontanous Breathing  Post-op Assessment: Report given to RN  Post vital signs: Reviewed and stable  Last Vitals:  Vitals:   05/09/17 1525 05/09/17 1555  BP: 134/77 101/76  Pulse:  80  Resp: 19 15  Temp:  37.2 C  SpO2: 99% 100%    Last Pain:  Vitals:   05/09/17 1411  TempSrc: Oral  PainSc:       Patients Stated Pain Goal: 8 (69/43/70 0525)  Complications: No apparent anesthesia complications

## 2017-05-09 NOTE — Anesthesia Postprocedure Evaluation (Signed)
Anesthesia Post Note  Patient: Arthur Johnson  Procedure(s) Performed: ESOPHAGOGASTRODUODENOSCOPY (EGD) WITH PROPOFOL (N/A )  Patient location during evaluation: PACU Anesthesia Type: MAC Level of consciousness: awake and alert and oriented Pain management: pain level controlled Vital Signs Assessment: post-procedure vital signs reviewed and stable Respiratory status: spontaneous breathing Cardiovascular status: blood pressure returned to baseline Postop Assessment: no apparent nausea or vomiting Anesthetic complications: no     Last Vitals:  Vitals:   05/09/17 1600 05/09/17 1615  BP: 114/72 119/73  Pulse: 81 78  Resp: (!) 23 (!) 21  Temp:    SpO2: 96% 94%    Last Pain:  Vitals:   05/09/17 1615  TempSrc:   PainSc: 0-No pain                 Lynea Rollison

## 2017-05-09 NOTE — Progress Notes (Signed)
Patient briefly seen and examined. Database reviewed. Discussed with wife and daughter at bedside. He is here with melena. Has dropped his Hb and is scheduled to have EGD today. Orders for 2 units of PRBCs. Has developed some edea after fluid resuscitation. ECHO from 2014 shows some diastolic dysfunction. Give lasix 40 IV x 1. Check ECHO. Will continue to follow.  Domingo Mend, MD [facility] Pager: [phone]

## 2017-05-09 NOTE — Progress Notes (Signed)
Brief EGD note.  Small sliding hiatal hernia. Mild portal hypertensive gastropathy. Mild portal hypertensive due to neuropathy. No evidence of esophageal or gastric varices. No evidence of peptic ulcer disease.   Continue octreotide infusion and ceftriaxone. Recent pantoprazole to 40 mg IV every 24 hours. Colonoscopy tomorrow morning by Dr. Oneida Alar under [facility]. GoLYTELY prep this afternoon.

## 2017-05-09 NOTE — Anesthesia Preprocedure Evaluation (Signed)
Anesthesia Evaluation  Patient identified by MRN, date of birth, ID band Patient awake    Reviewed: Allergy & Precautions, NPO status , Patient's Chart, lab work & pertinent test results, reviewed documented beta blocker date and time   History of Anesthesia Complications Negative for: history of anesthetic complications  Airway Mallampati: II  TM Distance: >3 FB Neck ROM: Full    Dental  (+) Teeth Intact   Pulmonary COPD,  COPD inhaler, Current Smoker,  Breath sounds deminished bilaterally.    + decreased breath sounds      Cardiovascular hypertension, Pt. on medications  Rhythm:Regular Rate:Normal     Neuro/Psych ETOH abuseChronic pain    GI/Hepatic GERD  Controlled and Medicated,(+) Cirrhosis   ascites    ,   Endo/Other    Renal/GU      Musculoskeletal   Abdominal   Peds  Hematology  (+) anemia ,   Anesthesia Other Findings   Reproductive/Obstetrics                             Anesthesia Physical Anesthesia Plan  ASA: III and emergent  Anesthesia Plan: MAC   Post-op Pain Management:    Induction:   PONV Risk Score and Plan: 0  Airway Management Planned: Simple Face Mask  Additional Equipment:   Intra-op Plan:   Post-operative Plan:   Informed Consent: I have reviewed the patients History and Physical, chart, labs and discussed the procedure including the risks, benefits and alternatives for the proposed anesthesia with the patient or authorized representative who has indicated his/her understanding and acceptance.   Dental advisory given  Plan Discussed with: Surgeon  Anesthesia Plan Comments:         Anesthesia Quick Evaluation

## 2017-05-09 NOTE — Progress Notes (Signed)
Pt up to [facility], RN looked at stool and it was strictly dark red blood with clumps of blood in it. Dr. Darrick Meigs paged and made aware. Waiting for call back/orders.

## 2017-05-09 NOTE — Progress Notes (Addendum)
Pt continuing to have bowel movements with blood in them, not strictly blood that pt was having last night. Will continue to monitor. VSS, no c/o pain. Pt asymptomatic.
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
## 2017-05-09 NOTE — Progress Notes (Signed)
New order for 2 units PRBCs. Pt educated and Blood consent signed, another IV site started. Rn called lab to make them aware. Waiting for blood to be ready

## 2017-05-09 NOTE — Progress Notes (Signed)
Patient being transported off floor for procedure per MD's order. Blood is transfusing at the time,post 15 min vital signs taken,and documented.

## 2017-05-10 ENCOUNTER — Encounter (HOSPITAL_COMMUNITY)
Admission: EM | Disposition: A | Discharge status: Home / Self Care | Payer: Self-pay | Source: Home / Self Care | Attending: Internal Medicine

## 2017-05-10 ENCOUNTER — Inpatient Hospital Stay (HOSPITAL_COMMUNITY): Payer: Medicare Other | Admitting: Anesthesiology

## 2017-05-10 ENCOUNTER — Inpatient Hospital Stay (HOSPITAL_COMMUNITY): Payer: Medicare Other

## 2017-05-10 ENCOUNTER — Encounter (HOSPITAL_COMMUNITY): Payer: Self-pay | Admitting: Anesthesiology

## 2017-05-10 DIAGNOSIS — K921 Melena: Secondary | ICD-10-CM

## 2017-05-10 DIAGNOSIS — D126 Benign neoplasm of colon, unspecified: Secondary | ICD-10-CM

## 2017-05-10 DIAGNOSIS — J189 Pneumonia, unspecified organism: Secondary | ICD-10-CM

## 2017-05-10 HISTORY — DX: Benign neoplasm of colon, unspecified: D12.6

## 2017-05-10 HISTORY — PX: COLONOSCOPY WITH PROPOFOL: SHX5780

## 2017-05-10 LAB — CBC
HEMATOCRIT: 27.1 % — AB (ref 39.0–52.0)
HEMOGLOBIN: 9.3 g/dL — AB (ref 13.0–17.0)
MCH: 33.9 pg (ref 26.0–34.0)
MCHC: 34.3 g/dL (ref 30.0–36.0)
MCV: 98.9 fL (ref 78.0–100.0)
Platelets: 94 10*3/uL — ABNORMAL LOW (ref 150–400)
RBC: 2.74 MIL/uL — ABNORMAL LOW (ref 4.22–5.81)
RDW: 16.9 % — ABNORMAL HIGH (ref 11.5–15.5)
WBC: 8.3 10*3/uL (ref 4.0–10.5)

## 2017-05-10 LAB — BASIC METABOLIC PANEL
ANION GAP: 8 (ref 5–15)
BUN: 10 mg/dL (ref 6–20)
CO2: 23 mmol/L (ref 22–32)
Calcium: 7.1 mg/dL — ABNORMAL LOW (ref 8.9–10.3)
Chloride: 101 mmol/L (ref 101–111)
Creatinine, Ser: 0.43 mg/dL — ABNORMAL LOW (ref 0.61–1.24)
GFR calc Af Amer: >60 mL/min (ref >60)
Glucose, Bld: 79 mg/dL (ref 65–99)
POTASSIUM: 2.9 mmol/L — AB (ref 3.5–5.1)
SODIUM: 132 mmol/L — AB (ref 135–145)

## 2017-05-10 LAB — MAGNESIUM: Magnesium: 1.2 mg/dL — ABNORMAL LOW (ref 1.7–2.4)

## 2017-05-10 LAB — PHOSPHORUS: PHOSPHORUS: 3.2 mg/dL (ref 2.5–4.6)

## 2017-05-10 LAB — HIV ANTIBODY (ROUTINE TESTING W REFLEX): HIV SCREEN 4TH GENERATION: NONREACTIVE

## 2017-05-10 LAB — HEPATITIS C ANTIBODY

## 2017-05-10 LAB — HEPATITIS B SURFACE ANTIGEN: Hepatitis B Surface Ag: NEGATIVE

## 2017-05-10 SURGERY — COLONOSCOPY WITH PROPOFOL
Anesthesia: Monitor Anesthesia Care

## 2017-05-10 SURGERY — AGILE CAPSULE

## 2017-05-10 MED ORDER — LACTATED RINGERS IV SOLN
INTRAVENOUS | Status: DC | PRN
  Administered 2017-05-10: 08:00:00 via INTRAVENOUS

## 2017-05-10 MED ORDER — MAGNESIUM SULFATE 2 GM/50ML IV SOLN
2.0000 g | Freq: Once | INTRAVENOUS | Status: AC
  Administered 2017-05-10: 2 g via INTRAVENOUS
  Filled 2017-05-10: qty 50

## 2017-05-10 MED ORDER — POTASSIUM CHLORIDE IN NACL 40-0.9 MEQ/L-% IV SOLN
INTRAVENOUS | Status: DC
  Administered 2017-05-10: 125 mL/h via INTRAVENOUS

## 2017-05-10 MED ORDER — MIDAZOLAM HCL 2 MG/2ML IJ SOLN
INTRAMUSCULAR | Status: AC
  Filled 2017-05-10: qty 2

## 2017-05-10 MED ORDER — CHLORDIAZEPOXIDE HCL 25 MG PO CAPS
50.0000 mg | ORAL_CAPSULE | Freq: Four times a day (QID) | ORAL | Status: AC
  Administered 2017-05-10 (×3): 50 mg via ORAL
  Filled 2017-05-10 (×6): qty 2

## 2017-05-10 MED ORDER — ONDANSETRON HCL 4 MG/2ML IJ SOLN
INTRAMUSCULAR | Status: AC
  Filled 2017-05-10: qty 2

## 2017-05-10 MED ORDER — LIDOCAINE HCL (PF) 1 % IJ SOLN
INTRAMUSCULAR | Status: AC
  Filled 2017-05-10: qty 15

## 2017-05-10 MED ORDER — NADOLOL 20 MG PO TABS
20.0000 mg | ORAL_TABLET | Freq: Every day | ORAL | Status: DC
  Administered 2017-05-11 – 2017-05-15 (×5): 20 mg via ORAL
  Filled 2017-05-10 (×6): qty 1

## 2017-05-10 MED ORDER — SODIUM CHLORIDE 0.9 % IV SOLN
3.0000 g | Freq: Three times a day (TID) | INTRAVENOUS | Status: DC
  Administered 2017-05-10 – 2017-05-15 (×15): 3 g via INTRAVENOUS
  Filled 2017-05-10 (×19): qty 3

## 2017-05-10 MED ORDER — PROPOFOL 10 MG/ML IV BOLUS
INTRAVENOUS | Status: AC
  Filled 2017-05-10: qty 20

## 2017-05-10 MED ORDER — PROPOFOL 10 MG/ML IV BOLUS
INTRAVENOUS | Status: AC
  Filled 2017-05-10: qty 40

## 2017-05-10 MED ORDER — POTASSIUM CHLORIDE 10 MEQ/100ML IV SOLN
10.0000 meq | INTRAVENOUS | Status: DC
Start: 2017-05-10 — End: 2017-05-10
  Administered 2017-05-10: 10 meq via INTRAVENOUS
  Filled 2017-05-10 (×2): qty 100

## 2017-05-10 MED ORDER — VITAMIN B-1 100 MG PO TABS
100.0000 mg | ORAL_TABLET | Freq: Every day | ORAL | Status: DC
  Administered 2017-05-10 – 2017-05-15 (×6): 100 mg via ORAL
  Filled 2017-05-10 (×6): qty 1

## 2017-05-10 MED ORDER — MIDAZOLAM HCL 5 MG/5ML IJ SOLN
INTRAMUSCULAR | Status: DC | PRN
  Administered 2017-05-10 (×2): 1 mg via INTRAVENOUS

## 2017-05-10 MED ORDER — FOLIC ACID 1 MG PO TABS
1.0000 mg | ORAL_TABLET | Freq: Every day | ORAL | Status: DC
  Administered 2017-05-10 – 2017-05-15 (×6): 1 mg via ORAL
  Filled 2017-05-10 (×6): qty 1

## 2017-05-10 MED ORDER — PANTOPRAZOLE SODIUM 40 MG PO TBEC
40.0000 mg | DELAYED_RELEASE_TABLET | Freq: Every day | ORAL | Status: DC
  Administered 2017-05-11 – 2017-05-15 (×5): 40 mg via ORAL
  Filled 2017-05-10 (×5): qty 1

## 2017-05-10 MED ORDER — POTASSIUM CHLORIDE 10 MEQ/100ML IV SOLN
10.0000 meq | INTRAVENOUS | Status: DC
  Administered 2017-05-10: 10 meq via INTRAVENOUS
  Filled 2017-05-10: qty 100

## 2017-05-10 MED ORDER — POTASSIUM CHLORIDE CRYS ER 20 MEQ PO TBCR
40.0000 meq | EXTENDED_RELEASE_TABLET | Freq: Once | ORAL | Status: AC
  Administered 2017-05-10: 40 meq via ORAL
  Filled 2017-05-10: qty 2

## 2017-05-10 MED ORDER — PROPOFOL 500 MG/50ML IV EMUL
INTRAVENOUS | Status: DC | PRN
  Administered 2017-05-10: 09:00:00 via INTRAVENOUS
  Administered 2017-05-10: 150 ug/kg/min via INTRAVENOUS
  Administered 2017-05-10: 09:00:00 via INTRAVENOUS

## 2017-05-10 MED ORDER — IPRATROPIUM-ALBUTEROL 20-100 MCG/ACT IN AERS
INHALATION_SPRAY | RESPIRATORY_TRACT | Status: DC | PRN
  Administered 2017-05-10: 4 via RESPIRATORY_TRACT

## 2017-05-10 MED ORDER — POTASSIUM CHLORIDE 10 MEQ/100ML IV SOLN
10.0000 meq | INTRAVENOUS | Status: AC
  Administered 2017-05-10 (×5): 10 meq via INTRAVENOUS
  Filled 2017-05-10 (×5): qty 100

## 2017-05-10 MED ORDER — POTASSIUM CHLORIDE CRYS ER 20 MEQ PO TBCR
40.0000 meq | EXTENDED_RELEASE_TABLET | ORAL | Status: AC
  Administered 2017-05-10 (×3): 40 meq via ORAL
  Filled 2017-05-10 (×3): qty 2

## 2017-05-10 MED ORDER — SUCCINYLCHOLINE CHLORIDE 20 MG/ML IJ SOLN
INTRAMUSCULAR | Status: AC
  Filled 2017-05-10: qty 1

## 2017-05-10 NOTE — Transfer of Care (Signed)
Immediate Anesthesia Transfer of Care Note  Patient: Arthur Johnson  Procedure(s) Performed: COLONOSCOPY WITH PROPOFOL (N/A )  Patient Location: PACU  Anesthesia Type:MAC  Level of Consciousness: awake, alert , oriented and patient cooperative  Airway & Oxygen Therapy: Patient Spontanous Breathing  Post-op Assessment: Report given to RN and Post -op Vital signs reviewed and stable  Post vital signs: Reviewed and stable  Last Vitals:  Vitals:   05/10/17 0523 05/10/17 0755  BP: (!) 116/59   Pulse: 71   Resp: 16   Temp: 36.7 C 36.9 C  SpO2: 99% 97%    Last Pain:  Vitals:   05/10/17 0755  TempSrc: Oral  PainSc:       Patients Stated Pain Goal: 8 (75/30/05 1102)  Complications: No apparent anesthesia complications

## 2017-05-10 NOTE — Progress Notes (Signed)
Dr. Olevia Bowens paged and made aware that pts Potassium level was 2.7. Waiting for orders/call back
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
## 2017-05-10 NOTE — Progress Notes (Signed)
Pt swallowed agile capsule without difficulty.

## 2017-05-10 NOTE — Progress Notes (Signed)
Agile pill given to patient.  He is to follow up with a KUB of the Abdomen in 28 hours.  Scheduled by Dr. Oneida Alar.

## 2017-05-10 NOTE — Anesthesia Postprocedure Evaluation (Signed)
Anesthesia Post Note  Patient: Arthur Johnson  Procedure(s) Performed: COLONOSCOPY WITH PROPOFOL (N/A )  Patient location during evaluation: PACU Anesthesia Type: MAC Level of consciousness: awake and alert, oriented and patient cooperative Pain management: pain level controlled Vital Signs Assessment: post-procedure vital signs reviewed and stable Respiratory status: spontaneous breathing Cardiovascular status: stable Postop Assessment: no apparent nausea or vomiting Anesthetic complications: no     Last Vitals:  Vitals:   05/10/17 0523 05/10/17 0755  BP: (!) 116/59   Pulse: 71   Resp: 16   Temp: 36.7 C 36.9 C  SpO2: 99% 97%    Last Pain:  Vitals:   05/10/17 0755  TempSrc: Oral  PainSc:                  ADAMS, AMY A

## 2017-05-10 NOTE — CV Procedure (Signed)
Echocardiogram not completed because the patient is undergoing another procedure.  Darlina Sicilian RDCS
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
## 2017-05-10 NOTE — Anesthesia Procedure Notes (Signed)
Procedure Name: MAC Date/Time: 05/10/2017 8:26 AM Performed by: Andree Elk Keyna Blizard A, CRNA Pre-anesthesia Checklist: Patient identified, Suction available, Emergency Drugs available, Patient being monitored and Timeout performed Patient Re-evaluated:Patient Re-evaluated prior to induction Oxygen Delivery Method: Simple face mask

## 2017-05-10 NOTE — Addendum Note (Signed)
Addendum  created 05/10/17 0929 by Mickel Baas, CRNA   Intraprocedure Event edited, Intraprocedure Meds edited, Intraprocedure Staff edited

## 2017-05-10 NOTE — Op Note (Addendum)
[facility] Patient Name: Arthur Johnson Procedure Date: 05/10/2017 7:01 AM MRN: [id number] Date of Birth: Sep 13, 1965 Attending MD: Barney Drain MD, MD CSN: [id number] Age: 51 Admit Type: Inpatient Procedure:                Colonoscopy WITH COLD SNARE/SNARE CAUTERY                            POLYPECTOMY Indications:              Hematochezia, Melena. CT ABD/PELVIS SHOWS CAVITARY                            LESION IN THE RLL. EGD NOV 21: NO VARICES. Providers:                Barney Drain MD, MD, Nelda Severe, RN, Aram Candela Referring MD:             Teodora Medici. Kim Medicines:                Propofol per Anesthesia Complications:            No immediate complications. Estimated Blood Loss:     Estimated blood loss was minimal. Procedure:                Pre-Anesthesia Assessment:                           - Prior to the procedure, a History and Physical                            was performed, and patient medications and                            allergies were reviewed. The patient's tolerance of                            previous anesthesia was also reviewed. The risks                            and benefits of the procedure and the sedation                            options and risks were discussed with the patient.                            All questions were answered, and informed consent                            was obtained. Prior Anticoagulants: The patient has                            taken no previous anticoagulant or antiplatelet                            agents. ASA Grade Assessment: II - A patient with  mild systemic disease. After reviewing the risks                            and benefits, the patient was deemed in                            satisfactory condition to undergo the procedure.                            After obtaining informed consent, the colonoscope                            was passed under direct vision.  Throughout the                            procedure, the patient's blood pressure, pulse, and                            oxygen saturations were monitored continuously. The                            EC-[id number] ([id number]) scope was introduced through                            the anus and advanced to the 5 cm into the ileum.                            The colonoscopy was technically difficult and                            complex due to significant looping. Successful                            completion of the procedure was aided by COLOWRAP.                            The terminal ileum, ileocecal valve, appendiceal                            orifice, and rectum were photographed. The patient                            tolerated the procedure well. The quality of the                            bowel preparation was good. Scope In: 8:38:54 AM Scope Out: 9:14:08 AM Scope Withdrawal Time: 0 hours 29 minutes 1 second  Total Procedure Duration: 0 hours 35 minutes 14 seconds  Findings:      The terminal ileum contained multiple small diverticula.      A 5 mm polyp was found in the proximal descending colon. The polyp was       sessile. The polyp was removed with a cold snare. Resection and       retrieval were complete.  Two sessile polyps were found in the rectum. The polyps were 6 to 12 mm       in size. These polyps were removed with a hot snare. Resection and       retrieval were complete.      External and internal hemorrhoids were found during retroflexion. The       hemorrhoids were moderate.      The rectum and recto-sigmoid colon revealed moderately excessive looping.      -NO OLD OR FRESH BLOOD SEEN IN ILEUM, COLON, OR RECTUM.      -MELENA MOST LIKELY DUE TO BLEEDING FROM DIVERTICULA IN ILEUM OR       ASCENDING COLON IN SETTING OF THROMBOCYTOPENIA      Multiple small and large-mouthed diverticula were found in the       recto-sigmoid colon, sigmoid colon, ascending colon and  cecum. Impression:               - Ileal diverticula.                           - One 5 mm polyp in the proximal descending colon,                            removed with a cold snare. Resected and retrieved.                           - Two 6 to 12 mm polyps in the rectum, removed with                            a hot snare. Resected and retrieved.                           - External and internal hemorrhoids.                           - There was significant looping of the RECTOSIGMOID                            colon. Moderate Sedation:      Per Anesthesia Care Recommendation:           - CLEAR LIQUIDS AT 1130. Cardiac diet AFTER 1600.                            AGILE CAPSULE SWALLOWED TODAY @0930 . KUB @1330 .                           - NEEDS GIVENS CAPSULE TO COMPLETE EVALUATION FOR                            OBSCURE GI BLEED IF AGILE CAPSULE PASSES THROUGH                            SMALL BOWEL.                           - Continue present medications. DISCUSSED CHEST CT  FINDINGS WITH DR. HATCHER. CHANGE ABX TO UNASYN FOR                            CAVITARY PNA. TREAT FOR 14 DAYS. REPEAT CT CHEST IN                            3 WEEKS.                           - Await pathology results. ADD LIBRIUM 50 MG QID x4                            TO PREVENT ETOH WITHDRAWAL.                           - Repeat colonoscopy 1-3 YEARS for                            surveillance.PROTONIX DAILY. D/C OCTREOTIDE.                           - Return patient to hospital ward for ongoing care. Procedure Code(s):        --- Professional ---                           [phone], Colonoscopy, flexible; with removal of                            tumor(s), polyp(s), or other lesion(s) by snare                            technique Diagnosis Code(s):        --- Professional ---                           K64.8, Other hemorrhoids                           D12.4, Benign neoplasm of  descending colon                           K62.1, Rectal polyp                           K92.1, Melena (includes Hematochezia)                           K57.10, Diverticulosis of small intestine without                            perforation or abscess without bleeding CPT copyright 2016 [employer]. All rights reserved. The codes documented in this report are preliminary and upon coder review may  be revised to meet current compliance requirements. Barney Drain, MD Barney Drain MD, MD 05/10/2017 9:45:18 AM This report has been signed electronically. Number of Addenda: 0

## 2017-05-10 NOTE — Anesthesia Preprocedure Evaluation (Addendum)
Anesthesia Evaluation  Patient identified by MRN, date of birth, ID band Patient awake    Airway Mallampati: II  TM Distance: >3 FB Neck ROM: Full    Dental  (+) Teeth Intact   Pulmonary COPD, Current Smoker,   Slight wheeze noted on right side   + decreased breath sounds      Cardiovascular hypertension,  Rhythm:Regular     Neuro/Psych    GI/Hepatic GERD  ,(+) Cirrhosis       ,   Endo/Other    Renal/GU      Musculoskeletal   Abdominal   Peds  Hematology   Anesthesia Other Findings   Reproductive/Obstetrics                            Anesthesia Physical Anesthesia Plan  ASA: III and emergent  Anesthesia Plan: MAC   Post-op Pain Management:    Induction:   PONV Risk Score and Plan:   Airway Management Planned: Mask  Additional Equipment:   Intra-op Plan:   Post-operative Plan:   Informed Consent:   Dental advisory given  Plan Discussed with: CRNA and Surgeon  Anesthesia Plan Comments: (K+ 2.9; Receiving runs now; discussed with Dr. Oneida Alar;)        Anesthesia Quick Evaluation

## 2017-05-11 ENCOUNTER — Inpatient Hospital Stay (HOSPITAL_COMMUNITY): Payer: Medicare Other

## 2017-05-11 DIAGNOSIS — F10230 Alcohol dependence with withdrawal, uncomplicated: Secondary | ICD-10-CM

## 2017-05-11 DIAGNOSIS — F10239 Alcohol dependence with withdrawal, unspecified: Secondary | ICD-10-CM

## 2017-05-11 DIAGNOSIS — F10939 Alcohol use, unspecified with withdrawal, unspecified: Secondary | ICD-10-CM

## 2017-05-11 DIAGNOSIS — F1010 Alcohol abuse, uncomplicated: Secondary | ICD-10-CM

## 2017-05-11 DIAGNOSIS — I351 Nonrheumatic aortic (valve) insufficiency: Secondary | ICD-10-CM

## 2017-05-11 LAB — ECHOCARDIOGRAM COMPLETE
AVLVOTPG: 5 mmHg
CHL CUP MV DEC (S): 246
E decel time: 246 msec
EERAT: 12.6
FS: 36 % (ref 28–44)
Height: 68 in
IV/PV OW: 0.97
LA diam end sys: 43 mm
LA diam index: 2.14 cm/m2
LASIZE: 43 mm
LAVOL: 60.5 mL
LAVOLA4C: 59.4 mL
LAVOLIN: 30.1 mL/m2
LV TDI E'MEDIAL: 8.81
LV dias vol index: 41 mL/m2
LV dias vol: 82 mL (ref 62–150)
LV e' LATERAL: 8.49 cm/s
LV sys vol index: 15 mL/m2
LVEEAVG: 12.6
LVEEMED: 12.6
LVOT SV: 103 mL
LVOT VTI: 29.8 cm
LVOT area: 3.46 cm2
LVOT diameter: 21 mm
LVOT peak vel: 114 cm/s
LVSYSVOL: 30 mL (ref 21–61)
Lateral S' vel: 12.3 cm/s
MVPG: 5 mmHg
MVPKAVEL: 80.1 m/s
MVPKEVEL: 107 m/s
PW: 10.1 mm — AB (ref 0.6–1.1)
Simpson's disk: 64
Stroke v: 52 ml
TAPSE: 23 mm
TDI e' lateral: 8.49
Weight: 2912 oz

## 2017-05-11 LAB — CBC
HCT: 27.8 % — ABNORMAL LOW (ref 39.0–52.0)
Hemoglobin: 9.3 g/dL — ABNORMAL LOW (ref 13.0–17.0)
MCH: 34.3 pg — ABNORMAL HIGH (ref 26.0–34.0)
MCHC: 33.5 g/dL (ref 30.0–36.0)
MCV: 102.6 fL — ABNORMAL HIGH (ref 78.0–100.0)
Platelets: 115 K/uL — ABNORMAL LOW (ref 150–400)
RBC: 2.71 MIL/uL — ABNORMAL LOW (ref 4.22–5.81)
RDW: 16.8 % — ABNORMAL HIGH (ref 11.5–15.5)
WBC: 7.6 K/uL (ref 4.0–10.5)

## 2017-05-11 LAB — VITAMIN B12: VITAMIN B 12: 1262 pg/mL — AB (ref 180–914)

## 2017-05-11 LAB — BASIC METABOLIC PANEL
Anion gap: 6 (ref 5–15)
BUN: 6 mg/dL (ref 6–20)
CALCIUM: 7.5 mg/dL — AB (ref 8.9–10.3)
CO2: 26 mmol/L (ref 22–32)
CREATININE: 0.53 mg/dL — AB (ref 0.61–1.24)
Chloride: 100 mmol/L — ABNORMAL LOW (ref 101–111)
GFR calc Af Amer: >60 mL/min (ref >60)
Glucose, Bld: 118 mg/dL — ABNORMAL HIGH (ref 65–99)
Potassium: 4.6 mmol/L (ref 3.5–5.1)
SODIUM: 132 mmol/L — AB (ref 135–145)

## 2017-05-11 LAB — MAGNESIUM: MAGNESIUM: 1.7 mg/dL (ref 1.7–2.4)

## 2017-05-11 MED ORDER — ADULT MULTIVITAMIN W/MINERALS CH
1.0000 | ORAL_TABLET | Freq: Every day | ORAL | Status: DC
  Administered 2017-05-11 – 2017-05-15 (×5): 1 via ORAL
  Filled 2017-05-11 (×5): qty 1

## 2017-05-11 MED ORDER — ADULT MULTIVITAMIN W/MINERALS CH: 1.0000 | ORAL_TABLET | Freq: Every day | ORAL | Status: DC

## 2017-05-11 MED ORDER — LORAZEPAM 1 MG PO TABS
1.0000 mg | ORAL_TABLET | Freq: Four times a day (QID) | ORAL | Status: AC | PRN
  Administered 2017-05-11: 1 mg via ORAL
  Filled 2017-05-11: qty 1

## 2017-05-11 MED ORDER — LORAZEPAM 2 MG/ML IJ SOLN
0.0000 mg | Freq: Four times a day (QID) | INTRAMUSCULAR | Status: DC
  Administered 2017-05-11: 1 mg via INTRAVENOUS
  Administered 2017-05-11: 2 mg via INTRAVENOUS
  Administered 2017-05-12: 4 mg via INTRAVENOUS
  Administered 2017-05-12 (×2): 2 mg via INTRAVENOUS
  Administered 2017-05-13: 4 mg via INTRAVENOUS
  Administered 2017-05-13: 2 mg via INTRAVENOUS
  Filled 2017-05-11: qty 1
  Filled 2017-05-11 (×2): qty 2
  Filled 2017-05-11: qty 1
  Filled 2017-05-11: qty 2
  Filled 2017-05-11 (×4): qty 1

## 2017-05-11 MED ORDER — CHLORDIAZEPOXIDE HCL 25 MG PO CAPS
50.0000 mg | ORAL_CAPSULE | Freq: Once | ORAL | Status: AC
  Administered 2017-05-11: 50 mg via ORAL

## 2017-05-11 MED ORDER — LORAZEPAM 2 MG/ML IJ SOLN
1.0000 mg | Freq: Four times a day (QID) | INTRAMUSCULAR | Status: AC | PRN
  Administered 2017-05-13 – 2017-05-14 (×5): 1 mg via INTRAVENOUS
  Filled 2017-05-11 (×5): qty 1

## 2017-05-11 MED ORDER — LORAZEPAM 2 MG/ML IJ SOLN
0.0000 mg | Freq: Two times a day (BID) | INTRAMUSCULAR | Status: DC
Start: 2017-05-13 — End: 2017-05-15
  Administered 2017-05-13 – 2017-05-14 (×2): 2 mg via INTRAVENOUS
  Filled 2017-05-11: qty 1
  Filled 2017-05-11: qty 2

## 2017-05-11 NOTE — Addendum Note (Signed)
Addendum  created 05/11/17 1127 by Vista Deck, CRNA   Sign clinical note

## 2017-05-11 NOTE — Anesthesia Postprocedure Evaluation (Signed)
Anesthesia Post Note  Patient: Arthur Johnson  Procedure(s) Performed: COLONOSCOPY WITH PROPOFOL (N/A )  Patient location during evaluation: Nursing Unit Anesthesia Type: MAC Level of consciousness: awake and awake and alert Pain management: satisfactory to patient Vital Signs Assessment: post-procedure vital signs reviewed and stable Respiratory status: spontaneous breathing Cardiovascular status: stable Postop Assessment: no apparent nausea or vomiting Anesthetic complications: no     Last Vitals:  Vitals:   05/11/17 0547 05/11/17 0800  BP: 111/67 (!) 110/50  Pulse: 68 68  Resp: 20 19  Temp: 36.7 C 36.7 C  SpO2: 99% 100%    Last Pain:  Vitals:   05/11/17 0800  TempSrc: Oral  PainSc:                  Drucie Opitz

## 2017-05-11 NOTE — Progress Notes (Signed)
Pt is anxious and states he is going to leave the hospital. Family is in the room.  I reinforced the need for him to stay in the hospital.  Patient agreed to sit in the recliner.  Medication given for anxiety and agitation. See [facility]

## 2017-05-11 NOTE — Progress Notes (Signed)
Called to patient's room, as patient has become agitated with IV lines when having to go to the bathroom.  Entered patient's room and patient had pulled out IV and pulled off tele box, ready to leave.  Patient somewhat more agitated this morning than last evening.  MD paged to renew order for Librium.

## 2017-05-11 NOTE — Progress Notes (Signed)
*  PRELIMINARY RESULTS* Echocardiogram 2D Echocardiogram has been performed.  Arthur Johnson 05/11/2017, 10:08 AM
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
## 2017-05-11 NOTE — Care Management Important Message (Signed)
Important Message  Patient Details  Name: Arthur Johnson MRN: [id number] Date of Birth: 04/16/1966   Medicare Important Message Given:  Yes    Sherald Barge, RN 05/11/2017, 2:09 PM

## 2017-05-12 ENCOUNTER — Encounter (HOSPITAL_COMMUNITY): Payer: Self-pay

## 2017-05-12 ENCOUNTER — Encounter (HOSPITAL_COMMUNITY)
Admission: EM | Disposition: A | Discharge status: Home / Self Care | Payer: Self-pay | Source: Home / Self Care | Attending: Internal Medicine

## 2017-05-12 HISTORY — PX: GIVENS CAPSULE STUDY: SHX5432

## 2017-05-12 LAB — TYPE AND SCREEN
ABO/RH(D): A POS
ANTIBODY SCREEN: NEGATIVE
UNIT DIVISION: 0
Unit division: 0
Unit division: 0

## 2017-05-12 LAB — BPAM RBC
BLOOD PRODUCT EXPIRATION DATE: 201812122359
Blood Product Expiration Date: 201812122359
Blood Product Expiration Date: 201812122359
ISSUE DATE / TIME: 201811210528
ISSUE DATE / TIME: 201811211241
UNIT TYPE AND RH: 6200
Unit Type and Rh: 6200
Unit Type and Rh: 6200

## 2017-05-12 SURGERY — IMAGING PROCEDURE, GI TRACT, INTRALUMINAL, VIA CAPSULE

## 2017-05-12 MED ORDER — SODIUM CHLORIDE 0.9 % IV SOLN: INTRAVENOUS | Status: DC
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
## 2017-05-12 NOTE — Progress Notes (Signed)
Spoke to Katharine Look, English as a second language teacher for Arthur Johnson. Understands that he is to have clear, colorless liquids (started at 0945AM per telephone call) until Dr. Oneida Alar gives an order for his diet. He is not to have an MRI until Given's passes. She will hand off that information to oncoming nurse. Verbalized understanding.
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
## 2017-05-12 NOTE — Progress Notes (Signed)
Pt still insisting he is leaving.  He is non-compliant w/telemetry & fall precautions.  Pt is unsteady on his feet & attempting to walk out.

## 2017-05-12 NOTE — Progress Notes (Signed)
Security at bedside

## 2017-05-12 NOTE — Progress Notes (Signed)
For the past hour pt has been insisting that he is leaving.  States he's been here a week and a half and has had enough.  He is angry that his family left the bedside, but they left thinking he would calm down and stop trying to go home if they weren't here.  So far there has been no change to his behavior.  Ativan 4mg  IV given.

## 2017-05-13 LAB — CBC
HEMATOCRIT: 30.9 % — AB (ref 39.0–52.0)
HEMOGLOBIN: 10.2 g/dL — AB (ref 13.0–17.0)
MCH: 34 pg (ref 26.0–34.0)
MCHC: 33 g/dL (ref 30.0–36.0)
MCV: 103 fL — ABNORMAL HIGH (ref 78.0–100.0)
Platelets: 153 10*3/uL (ref 150–400)
RBC: 3 MIL/uL — AB (ref 4.22–5.81)
RDW: 16.5 % — AB (ref 11.5–15.5)
WBC: 9.8 10*3/uL (ref 4.0–10.5)

## 2017-05-13 LAB — BASIC METABOLIC PANEL
Anion gap: 8 (ref 5–15)
BUN: <5 mg/dL — ABNORMAL LOW (ref 6–20)
CHLORIDE: 100 mmol/L — AB (ref 101–111)
CO2: 25 mmol/L (ref 22–32)
Calcium: 7.9 mg/dL — ABNORMAL LOW (ref 8.9–10.3)
Creatinine, Ser: 0.53 mg/dL — ABNORMAL LOW (ref 0.61–1.24)
GFR calc Af Amer: >60 mL/min (ref >60)
Glucose, Bld: 95 mg/dL (ref 65–99)
POTASSIUM: 3.5 mmol/L (ref 3.5–5.1)
SODIUM: 133 mmol/L — AB (ref 135–145)

## 2017-05-13 MED ORDER — TEMAZEPAM 15 MG PO CAPS
30.0000 mg | ORAL_CAPSULE | Freq: Once | ORAL | Status: AC
  Administered 2017-05-13: 30 mg via ORAL
  Filled 2017-05-13: qty 2

## 2017-05-13 MED ORDER — LORAZEPAM 2 MG/ML IJ SOLN
2.0000 mg | Freq: Once | INTRAMUSCULAR | Status: AC
  Administered 2017-05-13: 2 mg via INTRAVENOUS
  Filled 2017-05-13: qty 1

## 2017-05-13 NOTE — Progress Notes (Signed)
Patient is still extremely agitated, CIWA is 16, patient given 2mg  Ativan.

## 2017-05-13 NOTE — Procedures (Signed)
PATIENT DATA: WEIGHT:    WAIST:      HEIGHT:   GASTRIC PASSAGE TIME:  SB PASSAGE TIME:   RESULTS: LIMITED views of gastric mucosa due to retained contents. No old blood or BRIGHT RED BLOOD or  AVMs seen. Varices seen in midjejunum.  LIMITED VIEWS OF THE COLON DUE TO RETAINED CONTENTS. No old blood or fresh blood in the stomach.   DIAGNOSIS: GI BLEED due to diverticulosis or small bowel varices  Plan: 1. Supportive care. Transfuse if needed.  2. Monitor for additional bleeding.

## 2017-05-13 NOTE — Progress Notes (Signed)
Patient became extremely agitated and was determined to leave, IV was wrapped with Kerlix gauze to prevent patient from removing IV.  Patient given 4mg  of Ativan.  It is unsure how much of the Ativan patient received due to IV was leaking and the Kerlix became wet.  This IV was removed and a new IV started.

## 2017-05-13 NOTE — Progress Notes (Signed)
CIWA was repeated and was 17.  1mg  IV given for PRN dose.

## 2017-05-13 NOTE — Progress Notes (Signed)
PROGRESS NOTE    Arthur MARTINEZLOPEZ  ACZ:[id number] DOB: August 10, 1965 DOA: 05/08/2017 PCP: [name], MD     Brief Narrative:  51 year old man admitted from home on 11/20 due to melena.  He has been transfused 2 units of PRBCs so far.  Upper and lower endoscopies did not reveal source of active bleeding.  Capsule endoscopy is in process.  Since being hospitalized he has developed alcohol withdrawals.   Assessment & Plan:   Principal Problem:   GI bleed Active Problems:   Tobacco abuse   HTN (hypertension)   ETOH abuse   Hyponatremia   Melena   Alcohol withdrawal (HCC)   GI bleed -No source of bleeding identified on colonoscopy or endoscopy. -GI following, capsule endoscopy in process. -Continue Protonix.  Acute blood loss anemia -Due to active GI bleed. -Has received a total of 2 units of PRBCs and hemoglobin remained stable posttransfusion.  Alcohol abuse with withdrawals, non-complicated -Continue CIWA protocol with Ativan, although he appears less agitated and restless he is still quite confused. -Continue thiamine, folate.  Hypokalemia/hypomagnesemia -Adequately replaced.  Question of cavitary pneumonia -This was noticed on CT of the abdomen, chest x-ray does not show any evidence of this. -Remains on Unasyn which should be continued for a total of 5 days at which point antibiotics can be discontinued.      DVT prophylaxis: SCDs Code Status: Full code Family Communication: Discussed with wife all questions answered Disposition Plan: Hope for discharge home once medically stable  Consultants:   GI  Procedures:   EGD  Colonoscopy   capsule endoscopy   Antimicrobials:  Anti-infectives (From admission, onward)   Start     Dose/Rate Route Frequency Ordered Stop   05/10/17 1200  Ampicillin-Sulbactam (UNASYN) 3 g in sodium chloride 0.9 % 100 mL IVPB     3 g 200 mL/hr over 30 Minutes Intravenous Every 8 hours 05/10/17 1023     05/08/17 2200   cefTRIAXone (ROCEPHIN) 1 g in dextrose 5 % 50 mL IVPB  Status:  Discontinued     1 g 100 mL/hr over 30 Minutes Intravenous Every 24 hours 05/08/17 2055 05/10/17 1023       Subjective: Sitting in chair, states he is in [place].  Objective: Vitals:   05/12/17 1800 05/12/17 2123 05/13/17 0614 05/13/17 1451  BP: 135/72 (!) 110/56 136/78 124/76  Pulse: 96 91 (!) 103 79  Resp:  19 18 19   Temp:  98.5 F (36.9 C) 98.4 F (36.9 C) 97.9 F (36.6 C)  TempSrc:  Oral Oral Axillary  SpO2:  99% 100% 100%  Weight:      Height:        Intake/Output Summary (Last 24 hours) at 05/13/2017 1625 Last data filed at 05/13/2017 1300 Gross per 24 hour  Intake 1200 ml  Output -  Net 1200 ml   Filed Weights   05/08/17 1444 05/08/17 1825 05/09/17 1340  Weight: 77.1 kg (170 lb) 82.6 kg (182 lb) 82.6 kg (182 lb)    Examination:  General exam: Awake, confused, oriented to person only Respiratory system: Clear to auscultation. Respiratory effort normal. Cardiovascular system:RRR. No murmurs, rubs, gallops. Gastrointestinal system: Abdomen is nondistended, soft and nontender. No organomegaly or masses felt. Normal bowel sounds heard. Central nervous system:  No focal neurological deficits. Extremities: No C/C/E, +pedal pulses Skin: No rashes, lesions or ulcers Psychiatry: Unable to fully assess given current mental state    Data Reviewed: I have personally reviewed following labs and imaging studies  CBC: Recent Labs  Lab 05/08/17 1514  05/09/17 1655 05/09/17 2125 05/10/17 0409 05/11/17 0643 05/13/17 0618  WBC 13.4*   < > 9.4 10.4 8.3 7.6 9.8  NEUTROABS 9.4*  --   --   --   --   --   --   HGB 10.8*   < > 9.9* 9.5* 9.3* 9.3* 10.2*  HCT 31.1*   < > 30.0* 28.6* 27.1* 27.8* 30.9*  MCV 100.3*   < > 99.7 99.0 98.9 102.6* 103.0*  PLT 159   < > 100* 107* 94* 115* 153   < > = values in this interval not displayed.   Basic Metabolic Panel: Recent Labs  Lab 05/08/17 1514 05/09/17 0243  05/10/17 0409 05/11/17 0643 05/13/17 0618  NA 131* 133* 132* 132* 133*  K 3.3* 3.5 2.9* 4.6 3.5  CL 97* 102 101 100* 100*  CO2 21* 25 23 26 25   GLUCOSE 148* 118* 79 118* 95  BUN 14 15 10 6  <5*  CREATININE 0.42* 0.51* 0.43* 0.53* 0.53*  CALCIUM 7.6* 7.1* 7.1* 7.5* 7.9*  MG  --   --  1.2* 1.7  --   PHOS  --   --  3.2  --   --    GFR: Estimated Creatinine Clearance: 117.1 mL/min (A) (by C-G formula based on SCr of 0.53 mg/dL (L)). Liver Function Tests: Recent Labs  Lab 05/08/17 1514  AST 106*  ALT 52  ALKPHOS 97  BILITOT 1.8*  PROT 5.5*  ALBUMIN 2.5*   Recent Labs  Lab 05/08/17 1514  LIPASE 310*   No results for input(s): AMMONIA in the last 168 hours. Coagulation Profile: Recent Labs  Lab 05/08/17 1514  INR 1.16   Cardiac Enzymes: No results for input(s): CKTOTAL, CKMB, CKMBINDEX, TROPONINI in the last 168 hours. BNP (last 3 results) No results for input(s): PROBNP in the last 8760 hours. HbA1C: No results for input(s): HGBA1C in the last 72 hours. CBG: No results for input(s): GLUCAP in the last 168 hours. Lipid Profile: No results for input(s): CHOL, HDL, LDLCALC, TRIG, CHOLHDL, LDLDIRECT in the last 72 hours. Thyroid Function Tests: No results for input(s): TSH, T4TOTAL, FREET4, T3FREE, THYROIDAB in the last 72 hours. Anemia Panel: Recent Labs    05/11/17 0958  VITAMINB12 1,262*   Urine analysis:    Component Value Date/Time   COLORURINE YELLOW 04/22/2013 [place] 04/22/2013 1157   LABSPEC <1.005 (L) 04/22/2013 1157   PHURINE 6.0 04/22/2013 1157   GLUCOSEU NEGATIVE 04/22/2013 1157   HGBUR NEGATIVE 04/22/2013 1157   BILIRUBINUR NEGATIVE 04/22/2013 1157   KETONESUR NEGATIVE 04/22/2013 1157   PROTEINUR NEGATIVE 04/22/2013 1157   UROBILINOGEN 0.2 04/22/2013 1157   NITRITE NEGATIVE 04/22/2013 1157   LEUKOCYTESUR NEGATIVE 04/22/2013 1157   Sepsis Labs: @LABRCNTIP (procalcitonin:4,lacticidven:4)  )No results found for this or any  previous visit (from the past 240 hour(s)).       Radiology Studies: No results found.      Scheduled Meds: . folic acid  1 mg Oral Daily  . LORazepam  0-4 mg Intravenous Q12H  . multivitamin with minerals  1 tablet Oral Daily  . nadolol  20 mg Oral Daily  . pantoprazole  40 mg Oral QAC breakfast  . thiamine  100 mg Oral Daily   Continuous Infusions: . sodium chloride    . ampicillin-sulbactam (UNASYN) IV 3 g (05/13/17 1248)     LOS: 5 days    Time spent: 25 minutes. Greater than  50% of this time was spent in direct contact with the patient coordinating care.     Lelon Frohlich, MD [facility] Pager [phone]  If 7PM-7AM, please contact night-coverage [email or web address] Password [facility] 05/13/2017, 4:25 PM

## 2017-05-13 NOTE — Progress Notes (Signed)
CIWA score at 1200 was 10, patient given 2mg  Ativan.

## 2017-05-13 NOTE — Progress Notes (Signed)
Patient's wife called this morning and wanted to know if patient received Ativan during the night.  Wife voiced concern that she did not know how she would get him out of the hospital.  Stated she felt like the medicine was messing him up.  Advised patient's wife that we could page doctor when she was at the hospital today for questions.

## 2017-05-13 NOTE — Progress Notes (Signed)
Ativan 2 to 4 mg scheduled for 2130 was put on hold due to patient resting quietly and sleeping.

## 2017-05-14 ENCOUNTER — Encounter (HOSPITAL_COMMUNITY): Payer: Self-pay | Admitting: Family Medicine

## 2017-05-14 LAB — COMPREHENSIVE METABOLIC PANEL
ALBUMIN: 2.5 g/dL — AB (ref 3.5–5.0)
ALT: 59 U/L (ref 17–63)
ANION GAP: 10 (ref 5–15)
AST: 102 U/L — ABNORMAL HIGH (ref 15–41)
Alkaline Phosphatase: 112 U/L (ref 38–126)
BILIRUBIN TOTAL: 1.8 mg/dL — AB (ref 0.3–1.2)
BUN: <5 mg/dL — ABNORMAL LOW (ref 6–20)
CO2: 23 mmol/L (ref 22–32)
Calcium: 7.8 mg/dL — ABNORMAL LOW (ref 8.9–10.3)
Chloride: 98 mmol/L — ABNORMAL LOW (ref 101–111)
Creatinine, Ser: 0.53 mg/dL — ABNORMAL LOW (ref 0.61–1.24)
GFR calc Af Amer: >60 mL/min (ref >60)
Glucose, Bld: 95 mg/dL (ref 65–99)
POTASSIUM: 3.3 mmol/L — AB (ref 3.5–5.1)
Sodium: 131 mmol/L — ABNORMAL LOW (ref 135–145)
TOTAL PROTEIN: 5.9 g/dL — AB (ref 6.5–8.1)

## 2017-05-14 LAB — CBC
HEMATOCRIT: 32.2 % — AB (ref 39.0–52.0)
Hemoglobin: 10.5 g/dL — ABNORMAL LOW (ref 13.0–17.0)
MCH: 33.8 pg (ref 26.0–34.0)
MCHC: 32.6 g/dL (ref 30.0–36.0)
MCV: 103.5 fL — AB (ref 78.0–100.0)
PLATELETS: 183 10*3/uL (ref 150–400)
RBC: 3.11 MIL/uL — ABNORMAL LOW (ref 4.22–5.81)
RDW: 16.7 % — AB (ref 11.5–15.5)
WBC: 9.9 10*3/uL (ref 4.0–10.5)

## 2017-05-14 LAB — FOLATE RBC
FOLATE, HEMOLYSATE: 357.3 ng/mL
Folate, RBC: 1207 ng/mL (ref >498)
Hematocrit: 29.6 % — ABNORMAL LOW (ref 37.5–51.0)

## 2017-05-14 MED ORDER — SPIRONOLACTONE 25 MG PO TABS
50.0000 mg | ORAL_TABLET | Freq: Every day | ORAL | Status: DC
  Administered 2017-05-14 – 2017-05-15 (×2): 50 mg via ORAL
  Filled 2017-05-14 (×2): qty 2

## 2017-05-14 MED ORDER — POTASSIUM CHLORIDE CRYS ER 20 MEQ PO TBCR
40.0000 meq | EXTENDED_RELEASE_TABLET | Freq: Once | ORAL | Status: AC
  Administered 2017-05-14: 40 meq via ORAL
  Filled 2017-05-14: qty 2

## 2017-05-14 MED ORDER — LORAZEPAM 2 MG/ML IJ SOLN: 1.0000 mg | Freq: Four times a day (QID) | INTRAMUSCULAR | Status: DC | PRN

## 2017-05-14 MED ORDER — LORAZEPAM 1 MG PO TABS: 1.0000 mg | ORAL_TABLET | Freq: Four times a day (QID) | ORAL | Status: DC | PRN

## 2017-05-14 NOTE — Progress Notes (Addendum)
05/14/2017 5:03 PM  RN Sharyn Lull called and said that patient threatening to leave AMA and still going through alcohol withdrawal.  I asked her to immediately call hospital security services.  She texted me back "I called security. Pt is not IC.Marland KitchenMarland KitchenSecurity will not detain him"   I spoke with his emergency contact and she is in the hospital and will try to talk with him and I will try to speak with him.  I will explain to him the risks of leaving AMA including the risk of death.  His emergency contact tells me that the patient wants to go home and start drinking alcohol again.  I will not be able to force patient to stay in hospital for treatment.    I spoke with patient at bedside and he has agreed to stay another night but says he will leave AMA tomorrow if not discharged.    Murvin Natal MD

## 2017-05-14 NOTE — Addendum Note (Signed)
Addendum  created 05/14/17 1120 by Vista Deck, CRNA   Charge Capture section accepted

## 2017-05-14 NOTE — Progress Notes (Signed)
PROGRESS NOTE    Arthur Johnson  HKV:[id number] DOB: 06-08-1966 DOA: 05/08/2017 PCP: [name], MD     Brief Narrative:  51 year old man admitted from home on 11/20 due to melena.  He has been transfused 2 units of PRBCs so far.  Upper and lower endoscopies did not reveal source of active bleeding.  Capsule endoscopy completed no source of bleed found.   Since being hospitalized he has developed alcohol withdrawals.  Assessment & Plan:   Principal Problem:   GI bleed Active Problems:   Tobacco abuse   HTN (hypertension)   ETOH abuse   Hyponatremia   Melena   Alcohol withdrawal (HCC)  GI bleed -No source of bleeding identified on colonoscopy or endoscopy or capsule study. -GI following. Recommend supportive care and transfuse if needed.  Monitoring.  -Continue Protonix.  Acute blood loss anemia -Due to active GI bleed. -Has received a total of 2 units of PRBCs and hemoglobin remained stable posttransfusion.  Alcohol abuse with withdrawals, non-complicated -Continue CIWA protocol with Ativan, although he appears less agitated and restless he is still quite confused. -Continue thiamine, folate.  Hypokalemia/hypomagnesemia -Adequately replaced.  Question of cavitary pneumonia -This was noticed on CT of the abdomen, chest x-ray does not show any evidence of this. -Remains on Unasyn which should be continued for a total of 5 days at which point antibiotics can be discontinued.  DVT prophylaxis: SCDs Code Status: Full code Family Communication: Discussed with wife all questions answered Disposition Plan: DC in 1-2 days  Consultants:   GI  Procedures:   EGD  Colonoscopy   capsule endoscopy   Antimicrobials:  Anti-infectives (From admission, onward)   Start     Dose/Rate Route Frequency Ordered Stop   05/10/17 1200  Ampicillin-Sulbactam (UNASYN) 3 g in sodium chloride 0.9 % 100 mL IVPB     3 g 200 mL/hr over 30 Minutes Intravenous Every 8 hours 05/10/17  1023     05/08/17 2200  cefTRIAXone (ROCEPHIN) 1 g in dextrose 5 % 50 mL IVPB  Status:  Discontinued     1 g 100 mL/hr over 30 Minutes Intravenous Every 24 hours 05/08/17 2055 05/10/17 1023       Subjective: Pt slept well, he is still confused at times, not as agitated per sitter.   Objective: Vitals:   05/13/17 0614 05/13/17 1451 05/13/17 1706 05/14/17 0606  BP: 136/78 124/76 132/73 (!) 111/54  Pulse: (!) 103 79 85 78  Resp: 18 19 20 18   Temp: 98.4 F (36.9 C) 97.9 F (36.6 C) 98.5 F (36.9 C) 98.2 F (36.8 C)  TempSrc: Oral Axillary Oral Oral  SpO2: 100% 100% 100% 94%  Weight:      Height:        Intake/Output Summary (Last 24 hours) at 05/14/2017 0954 Last data filed at 05/13/2017 2015 Gross per 24 hour  Intake 720 ml  Output 350 ml  Net 370 ml   Filed Weights   05/08/17 1444 05/08/17 1825 05/09/17 1340  Weight: 77.1 kg (170 lb) 82.6 kg (182 lb) 82.6 kg (182 lb)    Examination:  General exam: Awake, confused, oriented to person and place only Respiratory system: Clear to auscultation. Respiratory effort normal. Cardiovascular system: normal s1, s2 sounds. No murmurs, rubs, gallops. Gastrointestinal system: Abdomen is nondistended, soft and nontender. No organomegaly or masses felt. Normal bowel sounds heard. Central nervous system:  No focal neurological deficits. Extremities: No C/C/E, +pedal pulses Skin: No rashes, lesions or ulcers Psychiatry: Unable  to fully assess given current mental state  Data Reviewed: I have personally reviewed following labs and imaging studies  CBC: Recent Labs  Lab 05/08/17 1514  05/09/17 1655 05/09/17 2125 05/10/17 0409 05/11/17 0643 05/13/17 0618  WBC 13.4*   < > 9.4 10.4 8.3 7.6 9.8  NEUTROABS 9.4*  --   --   --   --   --   --   HGB 10.8*   < > 9.9* 9.5* 9.3* 9.3* 10.2*  HCT 31.1*   < > 30.0* 28.6* 27.1* 27.8* 30.9*  MCV 100.3*   < > 99.7 99.0 98.9 102.6* 103.0*  PLT 159   < > 100* 107* 94* 115* 153   < > = values  in this interval not displayed.   Basic Metabolic Panel: Recent Labs  Lab 05/08/17 1514 05/09/17 0243 05/10/17 0409 05/11/17 0643 05/13/17 0618  NA 131* 133* 132* 132* 133*  K 3.3* 3.5 2.9* 4.6 3.5  CL 97* 102 101 100* 100*  CO2 21* 25 23 26 25   GLUCOSE 148* 118* 79 118* 95  BUN 14 15 10 6  <5*  CREATININE 0.42* 0.51* 0.43* 0.53* 0.53*  CALCIUM 7.6* 7.1* 7.1* 7.5* 7.9*  MG  --   --  1.2* 1.7  --   PHOS  --   --  3.2  --   --    GFR: Estimated Creatinine Clearance: 117.1 mL/min (A) (by C-G formula based on SCr of 0.53 mg/dL (L)). Liver Function Tests: Recent Labs  Lab 05/08/17 1514  AST 106*  ALT 52  ALKPHOS 97  BILITOT 1.8*  PROT 5.5*  ALBUMIN 2.5*   Recent Labs  Lab 05/08/17 1514  LIPASE 310*   No results for input(s): AMMONIA in the last 168 hours. Coagulation Profile: Recent Labs  Lab 05/08/17 1514  INR 1.16   Cardiac Enzymes: No results for input(s): CKTOTAL, CKMB, CKMBINDEX, TROPONINI in the last 168 hours. BNP (last 3 results) No results for input(s): PROBNP in the last 8760 hours. HbA1C: No results for input(s): HGBA1C in the last 72 hours. CBG: No results for input(s): GLUCAP in the last 168 hours. Lipid Profile: No results for input(s): CHOL, HDL, LDLCALC, TRIG, CHOLHDL, LDLDIRECT in the last 72 hours. Thyroid Function Tests: No results for input(s): TSH, T4TOTAL, FREET4, T3FREE, THYROIDAB in the last 72 hours. Anemia Panel: Recent Labs    05/11/17 0958  VITAMINB12 1,262*   Urine analysis:    Component Value Date/Time   COLORURINE YELLOW 04/22/2013 [place] 04/22/2013 1157   LABSPEC <1.005 (L) 04/22/2013 1157   PHURINE 6.0 04/22/2013 1157   GLUCOSEU NEGATIVE 04/22/2013 1157   HGBUR NEGATIVE 04/22/2013 1157   BILIRUBINUR NEGATIVE 04/22/2013 1157   KETONESUR NEGATIVE 04/22/2013 1157   PROTEINUR NEGATIVE 04/22/2013 1157   UROBILINOGEN 0.2 04/22/2013 1157   NITRITE NEGATIVE 04/22/2013 1157   LEUKOCYTESUR NEGATIVE  04/22/2013 1157   )No results found for this or any previous visit (from the past 240 hour(s)).   Radiology Studies: No results found.  Scheduled Meds: . folic acid  1 mg Oral Daily  . LORazepam  0-4 mg Intravenous Q12H  . multivitamin with minerals  1 tablet Oral Daily  . nadolol  20 mg Oral Daily  . pantoprazole  40 mg Oral QAC breakfast  . thiamine  100 mg Oral Daily   Continuous Infusions: . sodium chloride    . ampicillin-sulbactam (UNASYN) IV Stopped (05/14/17 0501)    LOS: 6 days   Time  spent: 25 minutes. Greater than 50% of this time was spent in direct contact with the patient coordinating care.  Irwin Brakeman, MD [facility] Pager [phone]  If 7PM-7AM, please contact night-coverage [email or web address] Password [facility] 05/14/2017, 9:54 AM

## 2017-05-14 NOTE — Addendum Note (Signed)
Addendum  created 05/14/17 1113 by Vista Deck, CRNA   Intraprocedure Event edited, Intraprocedure Staff edited

## 2017-05-14 NOTE — Progress Notes (Signed)
  Subjective:  Patient has no complaints.  He wants to go home as soon as possible.  He denies shortness of breath or cough.  He also denies abdominal pain.  According to nursing staff he is having formed brown stools.  He has good appetite.  Objective: Blood pressure (!) 112/56, pulse 78, temperature 98.3 F (36.8 C), temperature source Oral, resp. rate 20, height 5\' 8"  (1.727 m), weight 182 lb (82.6 kg), SpO2 97 %. Patient is alert and in no acute distress. He does not have asterixis. Abdomen is full but soft and nontender organomegaly or masses. He has 1-2+ pitting edema involving both legs. He has multiple ecchymosis over both forearms.  Labs/studies Results:  Recent Labs    06-10-17 0618 05/14/17 1143  WBC 9.8 9.9  HGB 10.2* 10.5*  HCT 30.9* 32.2*  PLT 153 183    BMET  Recent Labs    06/10/17 0618 05/14/17 1143  NA 133* 131*  K 3.5 3.3*  CL 100* 98*  CO2 25 23  GLUCOSE 95 95  BUN <5* <5*  CREATININE 0.53* 0.53*  CALCIUM 7.9* 7.8*    LFT  Recent Labs    05/14/17 1143  PROT 5.9*  ALBUMIN 2.5*  AST 102*  ALT 59  ALKPHOS 112  BILITOT 1.8*    Colon polyp biopsy pending.  Assessment:  #1.  Two unit GI bleed.  He has undergone EGD colonoscopy and small bowel given capsule study.  No bleeding lesion noted on EGD.  Colonoscopy revealed ileal, proximal and distal colonic diverticula without stigmata of bleeding.  Also revealed 2 colonic polyps.  Small bowel given capsule study revealed jejunal varices doubt stigmata of bleed. Suspect patient bled from ileal or proximal colonic diverticula. He is not passing normal stools.  H&H remained stable.  #2.  Anemia.  Anemia appears to be secondary to GI bleed which has stopped as well as chronic disease anemia.  #3.  Alcoholic cirrhosis complicated by ascites.  Patient must refrain from drinking alcohol if he is going to have any chance of recovery.  #4.  Nodular densities at lung bases.  One with central cavitation.   This abnormality was not present on chest CT of 04/21/2017.  Abnormality noted on follow-up chest x-ray.  Patient is empirically being treated with antibiotics.  He will need follow-up chest CT in 6-8 weeks.   Recommendations:  Begin Spironolactone at a dose of 50 mg p.o. Daily. Continue acetaminophen which is as needed medication. Will check INR with a.m. lab.

## 2017-05-14 NOTE — Progress Notes (Signed)
Pt states he is leaving if the Doctor doesn't discharge him.  He is still actively showing s/s of withdrawal.  Currently encouraging patient to remain in hospital for treatment.  Will continue to follow.  Dr. Wynetta Emery aware

## 2017-05-15 ENCOUNTER — Encounter (HOSPITAL_COMMUNITY): Payer: Self-pay | Admitting: Family Medicine

## 2017-05-15 LAB — COMPREHENSIVE METABOLIC PANEL
ALBUMIN: 2.2 g/dL — AB (ref 3.5–5.0)
ALT: 49 U/L (ref 17–63)
ANION GAP: 8 (ref 5–15)
AST: 83 U/L — ABNORMAL HIGH (ref 15–41)
Alkaline Phosphatase: 105 U/L (ref 38–126)
BILIRUBIN TOTAL: 1.1 mg/dL (ref 0.3–1.2)
BUN: <5 mg/dL — ABNORMAL LOW (ref 6–20)
CO2: 23 mmol/L (ref 22–32)
Calcium: 7.5 mg/dL — ABNORMAL LOW (ref 8.9–10.3)
Chloride: 102 mmol/L (ref 101–111)
Creatinine, Ser: 0.61 mg/dL (ref 0.61–1.24)
GFR calc Af Amer: >60 mL/min (ref >60)
GLUCOSE: 147 mg/dL — AB (ref 65–99)
POTASSIUM: 3.2 mmol/L — AB (ref 3.5–5.1)
Sodium: 133 mmol/L — ABNORMAL LOW (ref 135–145)
TOTAL PROTEIN: 5.4 g/dL — AB (ref 6.5–8.1)

## 2017-05-15 LAB — CBC
HEMATOCRIT: 28 % — AB (ref 39.0–52.0)
Hemoglobin: 9.2 g/dL — ABNORMAL LOW (ref 13.0–17.0)
MCH: 33.9 pg (ref 26.0–34.0)
MCHC: 32.9 g/dL (ref 30.0–36.0)
MCV: 103.3 fL — ABNORMAL HIGH (ref 78.0–100.0)
Platelets: 184 10*3/uL (ref 150–400)
RBC: 2.71 MIL/uL — ABNORMAL LOW (ref 4.22–5.81)
RDW: 16.5 % — AB (ref 11.5–15.5)
WBC: 7.2 10*3/uL (ref 4.0–10.5)

## 2017-05-15 LAB — MAGNESIUM: MAGNESIUM: 1.8 mg/dL (ref 1.7–2.4)

## 2017-05-15 LAB — PROTIME-INR
INR: 1.14
Prothrombin Time: 14.5 seconds (ref 11.4–15.2)

## 2017-05-15 MED ORDER — SPIRONOLACTONE 50 MG PO TABS: 50.0000 mg | ORAL_TABLET | Freq: Every day | ORAL | 0 refills | Status: DC

## 2017-05-15 MED ORDER — POTASSIUM CHLORIDE CRYS ER 20 MEQ PO TBCR
60.0000 meq | EXTENDED_RELEASE_TABLET | Freq: Once | ORAL | Status: AC
  Administered 2017-05-15: 60 meq via ORAL
  Filled 2017-05-15: qty 3

## 2017-05-15 MED ORDER — ADULT MULTIVITAMIN W/MINERALS CH: 1.0000 | ORAL_TABLET | Freq: Every day | ORAL | Status: DC

## 2017-05-15 MED ORDER — THIAMINE HCL 100 MG PO TABS: 100.0000 mg | ORAL_TABLET | Freq: Every day | ORAL | 0 refills | Status: AC

## 2017-05-15 MED ORDER — MAGNESIUM SULFATE 2 GM/50ML IV SOLN
2.0000 g | Freq: Once | INTRAVENOUS | Status: AC
  Administered 2017-05-15: 2 g via INTRAVENOUS
  Filled 2017-05-15: qty 50

## 2017-05-15 MED ORDER — FOLIC ACID 1 MG PO TABS: 1.0000 mg | ORAL_TABLET | Freq: Every day | ORAL | 0 refills | Status: AC

## 2017-05-15 MED ORDER — NADOLOL 20 MG PO TABS: 20.0000 mg | ORAL_TABLET | Freq: Every day | ORAL | 0 refills | Status: DC

## 2017-05-15 NOTE — Plan of Care (Signed)
Pt in bed. Alert and oriented x2. Sitter at bedside. Pt speech slurred. Bilateral bruising to arms. 22G right forearm saline locked. Able to follow commands. No complaint of pain or distress at this time. Call light within reach. Bed in lowest position, will continue to monitor.

## 2017-05-15 NOTE — Progress Notes (Signed)
Arthur Johnson discharged Home per MD order.  Discharge instructions reviewed and discussed with the patient, all questions and concerns answered. Copy of instructions and scripts given to patient.  Allergies as of 05/15/2017   No Known Allergies     Medication List    TAKE these medications   ANORO ELLIPTA 62.5-25 MCG/INH Aepb Generic drug:  umeclidinium-vilanterol Inhale 1 puff into the lungs daily.   folic acid 1 MG tablet Commonly known as:  FOLVITE Take 1 tablet (1 mg total) by mouth daily. Start taking on:  05/16/2017   multivitamin with minerals Tabs tablet Take 1 tablet by mouth daily. Start taking on:  05/16/2017   nadolol 20 MG tablet Commonly known as:  CORGARD Take 1 tablet (20 mg total) by mouth daily. Start taking on:  05/16/2017   pantoprazole 20 MG tablet Commonly known as:  PROTONIX Take 20 mg by mouth daily.   spironolactone 50 MG tablet Commonly known as:  ALDACTONE Take 1 tablet (50 mg total) by mouth daily. Start taking on:  05/16/2017   thiamine 100 MG tablet Take 1 tablet (100 mg total) by mouth daily. Start taking on:  05/16/2017   triamterene-hydrochlorothiazide 37.5-25 MG tablet Commonly known as:  MAXZIDE-25 Take 1 tablet by mouth daily.   VENTOLIN HFA 108 (90 Base) MCG/ACT inhaler Generic drug:  albuterol Inhale 1-2 puffs into the lungs every 6 (six) hours as needed.       IV site discontinued and catheter remains intact. Site without signs and symptoms of complications. Dressing and pressure applied.  Patient escorted to car in a wheelchair,  no distress noted upon discharge.  Ralene Muskrat Jameson Morrow 05/15/2017 11:57 AM
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
## 2017-05-15 NOTE — Discharge Instructions (Signed)
Follow with Primary MD  [name], MD  and other consultant's as instructed your Hospitalist MD  Please get a complete blood count and chemistry panel checked by your Primary MD at your next visit, and again as instructed by your Primary MD.  Get Medicines reviewed and adjusted: Please take all your medications with you for your next visit with your Primary MD  Laboratory/radiological data: Please request your Primary MD to go over all hospital tests and procedure/radiological results at the follow up, please ask your Primary MD to get all Hospital records sent to his/her office.  In some cases, they will be blood work, cultures and biopsy results pending at the time of your discharge. Please request that your primary care M.D. follows up on these results.  Also Note the following: If you experience worsening of your admission symptoms, develop shortness of breath, life threatening emergency, suicidal or homicidal thoughts you must seek medical attention immediately by calling 911 or calling your MD immediately  if symptoms less severe.  You must read complete instructions/literature along with all the possible adverse reactions/side effects for all the Medicines you take and that have been prescribed to you. Take any new Medicines after you have completely understood and accpet all the possible adverse reactions/side effects.   Do not drive when taking Pain medications or sleeping medications (Benzodaizepines)  Do not take more than prescribed Pain, Sleep and Anxiety Medications. It is not advisable to combine anxiety,sleep and pain medications without talking with your primary care practitioner  Special Instructions: If you have smoked or chewed Tobacco  in the last 2 yrs please stop smoking, stop any regular Alcohol  and or any Recreational drug use.  Wear Seat belts while driving.  Please note: You were cared for by a hospitalist during your hospital stay. Once you are discharged, your  primary care physician will handle any further medical issues. Please note that NO REFILLS for any discharge medications will be authorized once you are discharged, as it is imperative that you return to your primary care physician (or establish a relationship with a primary care physician if you do not have one) for your post hospital discharge needs so that they can reassess your need for medications and monitor your lab values.     Gastrointestinal Bleeding Gastrointestinal bleeding is bleeding somewhere along the path food travels through the body (digestive tract). This path is anywhere between the mouth and the opening of the butt (anus). You may have blood in your poop (stools) or have black poop. If you throw up (vomit), there may be blood in it. This condition can be mild, serious, or even life-threatening. If you have a lot of bleeding, you may need to stay in the hospital. Follow these instructions at home:  Take over-the-counter and prescription medicines only as told by your doctor.  Eat foods that have a lot of fiber in them. These foods include whole grains, fruits, and vegetables. You can also try eating 1-3 prunes each day.  Drink enough fluid to keep your pee (urine) clear or pale yellow.  Keep all follow-up visits as told by your doctor. This is important. Contact a doctor if:  Your symptoms do not get better. Get help right away if:  Your bleeding gets worse.  You feel dizzy or you pass out (faint).  You feel weak.  You have very bad cramps in your back or belly (abdomen).  You pass large clumps of blood (clots) in your poop.  Your symptoms are getting worse. This information is not intended to replace advice given to you by your health care provider. Make sure you discuss any questions you have with your health care provider. Document Released: 03/14/2008 Document Revised: 11/11/2015 Document Reviewed: 11/23/2014 Elsevier Interactive Patient Education  2018  [place] Pneumonia, Adult Pneumonia is an infection of the lungs. There are different types of pneumonia. One type can develop while a person is in a hospital. A different type, called community-acquired pneumonia, develops in people who are not, or have not recently been, in the hospital or other health care facility. What are the causes? Pneumonia may be caused by bacteria, viruses, or funguses. Community-acquired pneumonia is often caused by Streptococcus pneumonia bacteria. These bacteria are often passed from one person to another by breathing in droplets from the cough or sneeze of an infected person. What increases the risk? The condition is more likely to develop in:  People who havechronic diseases, such as chronic obstructive pulmonary disease (COPD), asthma, congestive heart failure, cystic fibrosis, diabetes, or kidney disease.  People who haveearly-stage or late-stage HIV.  People who havesickle cell disease.  People who havehad their spleen removed (splenectomy).  People who havepoor Human resources officer.  People who havemedical conditions that increase the risk of breathing in (aspirating) secretions their own mouth and nose.  People who havea weakened immune system (immunocompromised).  People who smoke.  People whotravel to areas where pneumonia-causing germs commonly exist.  People whoare around animal habitats or animals that have pneumonia-causing germs, including birds, bats, rabbits, cats, and farm animals.  What are the signs or symptoms? Symptoms of this condition include:  Adry cough.  A wet (productive) cough.  Fever.  Sweating.  Chest pain, especially when breathing deeply or coughing.  Rapid breathing or difficulty breathing.  Shortness of breath.  Shaking chills.  Fatigue.  Muscle aches.  How is this diagnosed? Your health care provider will take a medical history and perform a physical exam. You may also  have other tests, including:  Imaging studies of your chest, including X-rays.  Tests to check your blood oxygen level and other blood gases.  Other tests on blood, mucus (sputum), fluid around your lungs (pleural fluid), and urine.  If your pneumonia is severe, other tests may be done to identify the specific cause of your illness. How is this treated? The type of treatment that you receive depends on many factors, such as the cause of your pneumonia, the medicines you take, and other medical conditions that you have. For most adults, treatment and recovery from pneumonia may occur at home. In some cases, treatment must happen in a hospital. Treatment may include:  Antibiotic medicines, if the pneumonia was caused by bacteria.  Antiviral medicines, if the pneumonia was caused by a virus.  Medicines that are given by mouth or through an IV tube.  Oxygen.  Respiratory therapy.  Although rare, treating severe pneumonia may include:  Mechanical ventilation. This is done if you are not breathing well on your own and you cannot maintain a safe blood oxygen level.  Thoracentesis. This procedureremoves fluid around one lung or both lungs to help you breathe better.  Follow these instructions at home:  Take over-the-counter and prescription medicines only as told by your health care provider. ? Only takecough medicine if you are losing sleep. Understand that cough medicine can prevent your bodys natural ability to remove mucus from your lungs. ? If you were prescribed an antibiotic  medicine, take it as told by your health care provider. Do not stop taking the antibiotic even if you start to feel better.  Sleep in a semi-upright position at night. Try sleeping in a reclining chair, or place a few pillows under your head.  Do not use tobacco products, including cigarettes, chewing tobacco, and e-cigarettes. If you need help quitting, ask your health care provider.  Drink enough water  to keep your urine clear or pale yellow. This will help to thin out mucus secretions in your lungs. How is this prevented? There are ways that you can decrease your risk of developing community-acquired pneumonia. Consider getting a pneumococcal vaccine if:  You are older than 49 years of age.  You are older than 49 years of age and are undergoing cancer treatment, have chronic lung disease, or have other medical conditions that affect your immune system. Ask your health care provider if this applies to you.  There are different types and schedules of pneumococcal vaccines. Ask your health care provider which vaccination option is best for you. You may also prevent community-acquired pneumonia if you take these actions:  Get an influenza vaccine every year. Ask your health care provider which type of influenza vaccine is best for you.  Go to the dentist on a regular basis.  Wash your hands often. Use hand sanitizer if soap and water are not available.  Contact a health care provider if:  You have a fever.  You are losing sleep because you cannot control your cough with cough medicine. Get help right away if:  You have worsening shortness of breath.  You have increased chest pain.  Your sickness becomes worse, especially if you are an older adult or have a weakened immune system.  You cough up blood. This information is not intended to replace advice given to you by your health care provider. Make sure you discuss any questions you have with your health care provider. Document Released: 06/05/2005 Document Revised: 10/14/2015 Document Reviewed: 09/30/2014 Elsevier Interactive Patient Education  2017 [employer].

## 2017-05-15 NOTE — Care Management Important Message (Signed)
Important Message  Patient Details  Name: LABRADFORD SCHNITKER MRN: [id number] Date of Birth: May 18, 1966   Medicare Important Message Given:  Yes    Orie Baxendale, Chauncey Reading, RN 05/15/2017, 8:07 AM

## 2017-05-16 ENCOUNTER — Telehealth: Payer: Self-pay | Admitting: Gastroenterology

## 2017-05-16 NOTE — Telephone Encounter (Signed)
Please call pt. HE had ONE simple adenoma AND TWO HYPERPLASTIC POLYPS REMOVED. removed. FOLLOW A HIGH FIBER DIET. NEXT TCS WITH DR. Laural Golden IN 5-10 YEARS.

## 2017-05-16 NOTE — Telephone Encounter (Signed)
5 yr TCS noted in recall, Tammy please call patient, I'm not comfortable telling patient Dr Oneida Alar recommnedations

## 2017-05-17 ENCOUNTER — Other Ambulatory Visit (INDEPENDENT_AMBULATORY_CARE_PROVIDER_SITE_OTHER): Payer: Self-pay | Admitting: *Deleted

## 2017-05-17 ENCOUNTER — Encounter (INDEPENDENT_AMBULATORY_CARE_PROVIDER_SITE_OTHER): Payer: Self-pay | Admitting: *Deleted

## 2017-05-17 DIAGNOSIS — R740 Nonspecific elevation of levels of transaminase and lactic acid dehydrogenase [LDH]: Secondary | ICD-10-CM

## 2017-05-17 DIAGNOSIS — K922 Gastrointestinal hemorrhage, unspecified: Secondary | ICD-10-CM

## 2017-05-17 DIAGNOSIS — R7401 Elevation of levels of liver transaminase levels: Secondary | ICD-10-CM

## 2017-05-17 NOTE — Progress Notes (Signed)
Other

## 2017-05-17 NOTE — Progress Notes (Signed)
A message has been sent to Reba for an appointment time on 05/31/17 with Dr. Laural Golden

## 2017-05-17 NOTE — Telephone Encounter (Signed)
Patient was called and given the results from the TCS that was preformed by Dr.Fields.

## 2017-06-05 ENCOUNTER — Telehealth (INDEPENDENT_AMBULATORY_CARE_PROVIDER_SITE_OTHER): Payer: Self-pay | Admitting: Internal Medicine

## 2017-06-05 ENCOUNTER — Ambulatory Visit (INDEPENDENT_AMBULATORY_CARE_PROVIDER_SITE_OTHER): Payer: Medicare Other | Admitting: Internal Medicine

## 2017-06-05 NOTE — Telephone Encounter (Signed)
I had called the patient at 8am this morning and lmoam to come in today at 1:30pm.  The patient did not come in.  I called him again this afternoon and lmoam to come in this Thursday, June 07, 2017 at 3:30pm and arrive at 3:15pm to see Dr. Laural Golden.  I also called his contact incase of an emergency and lmoam there as well.  I have tried to contact the patient all ways possible.

## 2017-06-05 NOTE — Telephone Encounter (Signed)
Received a cancellation for today at 1:30pm.  I called this patient and lmoam to come today at 1:30pm to see Dr. Laural Golden.  I did ask that the patient please return my call to let me know that he received my message and to confirm.

## 2017-06-06 ENCOUNTER — Telehealth (INDEPENDENT_AMBULATORY_CARE_PROVIDER_SITE_OTHER): Payer: Self-pay | Admitting: Internal Medicine

## 2017-06-06 NOTE — Telephone Encounter (Signed)
Patient called, stated that he may have to cancel his appointment for tomorrow, 06/07/17.  His ride may not be able to bring him.  I've told him how important it is that he please try to keep this appointment.  I asked him if there was someone that could be a backup ride if his ride is not able to get him here.  He stated that his daughter is home from college, but is staying with a friend for a few nights.  He is going to contact her and see if she can help if needed.  He stated that he would somehow make it here.  I gave him directions as well.

## 2017-06-06 NOTE — Telephone Encounter (Signed)
I called the patient again this morning trying to reach him to get him in the office to see Dr. Laural Golden tomorrow, 06/07/17.  I was not able to reach him.  I contacted his emergency contact and she stated that his nurse visited yesterday and it was a long day for him.  She did tell me that she will have him here for his appointment tomorrow, 06/07/17 at 3:15pm.

## 2017-06-07 ENCOUNTER — Encounter (INDEPENDENT_AMBULATORY_CARE_PROVIDER_SITE_OTHER): Payer: Self-pay | Admitting: Internal Medicine

## 2017-06-07 ENCOUNTER — Encounter (INDEPENDENT_AMBULATORY_CARE_PROVIDER_SITE_OTHER): Payer: Self-pay

## 2017-06-07 ENCOUNTER — Ambulatory Visit (INDEPENDENT_AMBULATORY_CARE_PROVIDER_SITE_OTHER): Payer: Medicare Other | Admitting: Internal Medicine

## 2017-06-07 VITALS — BP 118/78 | HR 67 | Temp 97.7°F | Resp 18 | Ht 68.0 in | Wt 171.1 lb

## 2017-06-07 DIAGNOSIS — K7031 Alcoholic cirrhosis of liver with ascites: Secondary | ICD-10-CM

## 2017-06-07 DIAGNOSIS — K709 Alcoholic liver disease, unspecified: Secondary | ICD-10-CM

## 2017-06-07 DIAGNOSIS — D62 Acute posthemorrhagic anemia: Secondary | ICD-10-CM

## 2017-06-07 LAB — CBC
HEMATOCRIT: 41.4 % (ref 38.5–50.0)
HEMOGLOBIN: 14.7 g/dL (ref 13.2–17.1)
MCH: 33.5 pg — ABNORMAL HIGH (ref 27.0–33.0)
MCHC: 35.5 g/dL (ref 32.0–36.0)
MCV: 94.3 fL (ref 80.0–100.0)
MPV: 10.9 fL (ref 7.5–12.5)
Platelets: 193 10*3/uL (ref 140–400)
RBC: 4.39 10*6/uL (ref 4.20–5.80)
RDW: 14.4 % (ref 11.0–15.0)
WBC: 11 10*3/uL — AB (ref 3.8–10.8)

## 2017-06-07 LAB — HEPATIC FUNCTION PANEL
AG RATIO: 0.9 (calc) — AB (ref 1.0–2.5)
ALT: 23 U/L (ref 9–46)
AST: 56 U/L — ABNORMAL HIGH (ref 10–40)
Albumin: 3.5 g/dL — ABNORMAL LOW (ref 3.6–5.1)
Alkaline phosphatase (APISO): 107 U/L (ref 40–115)
BILIRUBIN INDIRECT: 1 mg/dL (ref 0.2–1.2)
BILIRUBIN TOTAL: 1.5 mg/dL — AB (ref 0.2–1.2)
Bilirubin, Direct: 0.5 mg/dL — ABNORMAL HIGH (ref 0.0–0.2)
GLOBULIN: 3.7 g/dL (ref 1.9–3.7)
TOTAL PROTEIN: 7.2 g/dL (ref 6.1–8.1)

## 2017-06-07 LAB — BASIC METABOLIC PANEL
BUN / CREAT RATIO: 6 (calc) (ref 6–22)
BUN: 3 mg/dL — AB (ref 7–25)
CO2: 25 mmol/L (ref 20–32)
CREATININE: 0.54 mg/dL — AB (ref 0.60–1.35)
Calcium: 9.1 mg/dL (ref 8.6–10.3)
Chloride: 90 mmol/L — ABNORMAL LOW (ref 98–110)
Glucose, Bld: 94 mg/dL (ref 65–139)
Potassium: 4.4 mmol/L (ref 3.5–5.3)
Sodium: 127 mmol/L — ABNORMAL LOW (ref 135–146)

## 2017-06-07 NOTE — Progress Notes (Signed)
Presenting complaint;  Follow-up for alcoholic liver disease and anemia.  Database and subjective:  Patient is 51 year old Caucasian male who was hospitalized last month for melena and anemia.  He received 2 units of PRBCs.  He was also found to have alcoholic cirrhosis as well as alcoholic hepatitis.  He also had ascites.  GI workup included esophagogastroduodenoscopy revealing portal gastropathy but no stigmata of bleed.  Colonoscopy revealed 3 adenomas and he also had ileal and colonic diverticulosis.  He also underwent small bowel given capsule study.  It was concluded that he may have bled from ileal or colonic diverticula.  Patient was discharged on spironolactone.  Also has been on Maxide.  Patient was advised to quit drinking alcohol. He was to have blood work prior to this visit but he has not done so.  Patient is accompanied by his longtime friend [name].  Feels much better.  He has lost 20 pounds since his discharge.  He reports decrease in abdominal distention.  He has cut back alcohol intake to 1-2 cans of beer per day but he has not quit altogether.  We are vomiting or heartburn.  He is having normal stools daily.  He denies melena or rectal bleeding.  He does not take aspirin or other NSAIDs.   Current Medications: Outpatient Encounter Medications as of 06/07/2017  Medication Sig  . ANORO ELLIPTA 62.5-25 MCG/INH AEPB Inhale 1 puff into the lungs daily.   . folic acid (FOLVITE) 1 MG tablet Take 1 tablet (1 mg total) by mouth daily.  . Multiple Vitamin (MULTIVITAMIN WITH MINERALS) TABS tablet Take 1 tablet by mouth daily.  . nadolol (CORGARD) 20 MG tablet Take 1 tablet (20 mg total) by mouth daily.  . pantoprazole (PROTONIX) 20 MG tablet Take 20 mg by mouth daily.  Marland Kitchen spironolactone (ALDACTONE) 50 MG tablet Take 1 tablet (50 mg total) by mouth daily.  Marland Kitchen thiamine 100 MG tablet Take 1 tablet (100 mg total) by mouth daily.  Marland Kitchen triamterene-hydrochlorothiazide (MAXZIDE-25) 37.5-25 MG  tablet Take 1 tablet by mouth daily.   . VENTOLIN HFA 108 (90 Base) MCG/ACT inhaler Inhale 1-2 puffs into the lungs every 6 (six) hours as needed.   No facility-administered encounter medications on file as of 06/07/2017.      Objective: Blood pressure 118/78, pulse 67, temperature 97.7 F (36.5 C), temperature source Oral, resp. rate 18, height 5\' 8"  (1.727 m), weight 171 lb 1.6 oz (77.6 kg). Patient is alert and in no acute distress. He does not have asterixis. Conjunctiva is pink. Sclera is nonicteric Oropharyngeal mucosa is normal. No neck masses or thyromegaly noted. Cardiac exam with regular rhythm normal S1 and S2. No murmur or gallop noted. Lungs are clear to auscultation. Abdomen is full but soft and nontender.  Liver is enlarged and firm.  Left lobe is prominent.  Right lobe is also palpable about 5 cm below RCM and midclavicular line.  Spleen is not palpable. No LE edema or clubbing noted.  Labs/studies Results:  Lab data from 05/15/2017  WBC 7.2, H&H 9.2 and 28.0. Platelet count 184K.  Bilirubin 1.1, AP 105, AST 83, ALT 49 and albumin 2.2.  Assessment:  #1.  Alcoholic liver disease.  He has alcoholic cirrhosis with ascites and he also has alcoholic hepatitis.  Hopefully some hepatic function will recover if he is able to quit drinking alcohol altogether.  Ascites has decreased which is reassuring.  #2.  Anemia secondary to GI bleed.  He received 2 units of PRBCs during  hospitalization.  It remains to be seen if hemoglobin were correct to normal remain low.  He may also have an element of anemia of chronic disease.  #3.  Cirrhotic ascites.  As above he has lost 11 pounds.  Ascites has decreased.  He remains on low-dose Spironolactone and Maxide.   Plan:  Patient will go to the lab for CBC metabolic 7 and LFTs. Once again patient advised that he must quit drinking alcohol altogether if he is going to have better prognosis. Patient advised not to take acetaminophen  for now and can use Advil 200 mg daily as needed for musculoskeletal pain. Office visit in 2 months.

## 2017-06-07 NOTE — Patient Instructions (Signed)
Physician will call with results of blood test when completed. Goal is for you to stop drinking alcohol altogether.

## 2017-06-13 ENCOUNTER — Other Ambulatory Visit (INDEPENDENT_AMBULATORY_CARE_PROVIDER_SITE_OTHER): Payer: Self-pay | Admitting: *Deleted

## 2017-06-13 ENCOUNTER — Encounter (INDEPENDENT_AMBULATORY_CARE_PROVIDER_SITE_OTHER): Payer: Self-pay | Admitting: *Deleted

## 2017-06-13 DIAGNOSIS — K922 Gastrointestinal hemorrhage, unspecified: Secondary | ICD-10-CM

## 2017-06-13 DIAGNOSIS — R740 Nonspecific elevation of levels of transaminase and lactic acid dehydrogenase [LDH]: Principal | ICD-10-CM

## 2017-06-13 DIAGNOSIS — R7401 Elevation of levels of liver transaminase levels: Secondary | ICD-10-CM

## 2017-06-21 ENCOUNTER — Other Ambulatory Visit (HOSPITAL_COMMUNITY): Payer: Self-pay | Admitting: Family Medicine

## 2017-06-21 DIAGNOSIS — J189 Pneumonia, unspecified organism: Secondary | ICD-10-CM

## 2017-06-27 LAB — BASIC METABOLIC PANEL
BUN/Creatinine Ratio: 12 (calc) (ref 6–22)
BUN: 6 mg/dL — ABNORMAL LOW (ref 7–25)
CALCIUM: 9.4 mg/dL (ref 8.6–10.3)
CO2: 27 mmol/L (ref 20–32)
CREATININE: 0.5 mg/dL — AB (ref 0.60–1.35)
Chloride: 95 mmol/L — ABNORMAL LOW (ref 98–110)
GLUCOSE: 142 mg/dL — AB (ref 65–99)
Potassium: 4.4 mmol/L (ref 3.5–5.3)
SODIUM: 131 mmol/L — AB (ref 135–146)

## 2017-06-28 ENCOUNTER — Other Ambulatory Visit (INDEPENDENT_AMBULATORY_CARE_PROVIDER_SITE_OTHER): Payer: Self-pay | Admitting: *Deleted

## 2017-06-28 DIAGNOSIS — R7401 Elevation of levels of liver transaminase levels: Secondary | ICD-10-CM

## 2017-06-28 DIAGNOSIS — K922 Gastrointestinal hemorrhage, unspecified: Secondary | ICD-10-CM

## 2017-06-28 DIAGNOSIS — K921 Melena: Secondary | ICD-10-CM

## 2017-06-28 DIAGNOSIS — R740 Nonspecific elevation of levels of transaminase and lactic acid dehydrogenase [LDH]: Principal | ICD-10-CM

## 2017-07-03 ENCOUNTER — Ambulatory Visit (HOSPITAL_COMMUNITY)
Admission: RE | Admit: 2017-07-03 | Discharge: 2017-07-03 | Disposition: A | Discharge status: Home / Self Care | Payer: Medicare Other | Source: Ambulatory Visit | Attending: Family Medicine | Admitting: Family Medicine

## 2017-07-03 DIAGNOSIS — J189 Pneumonia, unspecified organism: Secondary | ICD-10-CM | POA: Diagnosis present

## 2017-07-03 DIAGNOSIS — K7460 Unspecified cirrhosis of liver: Secondary | ICD-10-CM | POA: Insufficient documentation

## 2017-07-03 DIAGNOSIS — J441 Chronic obstructive pulmonary disease with (acute) exacerbation: Secondary | ICD-10-CM | POA: Insufficient documentation

## 2017-07-03 DIAGNOSIS — I2510 Atherosclerotic heart disease of native coronary artery without angina pectoris: Secondary | ICD-10-CM | POA: Diagnosis not present

## 2017-07-03 DIAGNOSIS — I700 Atherosclerosis of aorta: Secondary | ICD-10-CM | POA: Insufficient documentation

## 2017-07-03 DIAGNOSIS — J439 Emphysema, unspecified: Secondary | ICD-10-CM | POA: Diagnosis present

## 2017-07-03 MED ORDER — IOPAMIDOL (ISOVUE-300) INJECTION 61%
75.0000 mL | Freq: Once | INTRAVENOUS | Status: AC | PRN
  Administered 2017-07-03: 75 mL via INTRAVENOUS

## 2017-07-13 ENCOUNTER — Encounter (INDEPENDENT_AMBULATORY_CARE_PROVIDER_SITE_OTHER): Payer: Self-pay | Admitting: *Deleted

## 2017-07-13 ENCOUNTER — Other Ambulatory Visit (INDEPENDENT_AMBULATORY_CARE_PROVIDER_SITE_OTHER): Payer: Self-pay | Admitting: *Deleted

## 2017-07-13 DIAGNOSIS — K921 Melena: Secondary | ICD-10-CM

## 2017-07-13 DIAGNOSIS — R740 Nonspecific elevation of levels of transaminase and lactic acid dehydrogenase [LDH]: Principal | ICD-10-CM

## 2017-07-13 DIAGNOSIS — R7401 Elevation of levels of liver transaminase levels: Secondary | ICD-10-CM

## 2017-07-13 DIAGNOSIS — K922 Gastrointestinal hemorrhage, unspecified: Secondary | ICD-10-CM

## 2017-08-15 LAB — BASIC METABOLIC PANEL
BUN/Creatinine Ratio: 8 (calc) (ref 6–22)
BUN: 5 mg/dL — AB (ref 7–25)
CO2: 28 mmol/L (ref 20–32)
CREATININE: 0.59 mg/dL — AB (ref 0.70–1.33)
Calcium: 9.5 mg/dL (ref 8.6–10.3)
Chloride: 93 mmol/L — ABNORMAL LOW (ref 98–110)
Glucose, Bld: 126 mg/dL — ABNORMAL HIGH (ref 65–99)
POTASSIUM: 4.7 mmol/L (ref 3.5–5.3)
Sodium: 129 mmol/L — ABNORMAL LOW (ref 135–146)

## 2017-08-15 LAB — HEPATIC FUNCTION PANEL
AG Ratio: 1.2 (calc) (ref 1.0–2.5)
ALT: 26 U/L (ref 9–46)
AST: 61 U/L — ABNORMAL HIGH (ref 10–35)
Albumin: 3.8 g/dL (ref 3.6–5.1)
Alkaline phosphatase (APISO): 92 U/L (ref 40–115)
Bilirubin, Direct: 0.4 mg/dL — ABNORMAL HIGH (ref 0.0–0.2)
Globulin: 3.3 g/dL (calc) (ref 1.9–3.7)
Indirect Bilirubin: 1 mg/dL (calc) (ref 0.2–1.2)
Total Bilirubin: 1.4 mg/dL — ABNORMAL HIGH (ref 0.2–1.2)
Total Protein: 7.1 g/dL (ref 6.1–8.1)

## 2017-08-23 ENCOUNTER — Other Ambulatory Visit (INDEPENDENT_AMBULATORY_CARE_PROVIDER_SITE_OTHER): Payer: Self-pay | Admitting: *Deleted

## 2017-08-23 DIAGNOSIS — K921 Melena: Secondary | ICD-10-CM

## 2017-08-27 ENCOUNTER — Ambulatory Visit (INDEPENDENT_AMBULATORY_CARE_PROVIDER_SITE_OTHER): Payer: Medicare Other | Admitting: Internal Medicine

## 2017-09-12 ENCOUNTER — Other Ambulatory Visit (HOSPITAL_COMMUNITY): Payer: Self-pay | Admitting: Family Medicine

## 2017-09-12 DIAGNOSIS — J984 Other disorders of lung: Secondary | ICD-10-CM

## 2017-09-12 DIAGNOSIS — J449 Chronic obstructive pulmonary disease, unspecified: Secondary | ICD-10-CM

## 2017-09-12 DIAGNOSIS — J189 Pneumonia, unspecified organism: Secondary | ICD-10-CM

## 2017-09-14 ENCOUNTER — Ambulatory Visit (HOSPITAL_COMMUNITY): Admission: RE | Admit: 2017-09-14 | Payer: Medicare Other | Source: Ambulatory Visit

## 2017-10-24 ENCOUNTER — Ambulatory Visit (HOSPITAL_COMMUNITY)
Admission: RE | Admit: 2017-10-24 | Discharge: 2017-10-24 | Disposition: A | Discharge status: Home / Self Care | Payer: Medicare Other | Source: Ambulatory Visit | Attending: Family Medicine | Admitting: Family Medicine

## 2017-10-24 DIAGNOSIS — J189 Pneumonia, unspecified organism: Secondary | ICD-10-CM | POA: Diagnosis present

## 2017-10-24 DIAGNOSIS — J984 Other disorders of lung: Secondary | ICD-10-CM

## 2017-10-24 DIAGNOSIS — I2510 Atherosclerotic heart disease of native coronary artery without angina pectoris: Secondary | ICD-10-CM | POA: Diagnosis not present

## 2017-10-24 DIAGNOSIS — I700 Atherosclerosis of aorta: Secondary | ICD-10-CM | POA: Diagnosis not present

## 2017-10-24 DIAGNOSIS — J449 Chronic obstructive pulmonary disease, unspecified: Secondary | ICD-10-CM | POA: Insufficient documentation

## 2017-10-24 DIAGNOSIS — R918 Other nonspecific abnormal finding of lung field: Secondary | ICD-10-CM | POA: Diagnosis not present

## 2017-10-29 ENCOUNTER — Other Ambulatory Visit (INDEPENDENT_AMBULATORY_CARE_PROVIDER_SITE_OTHER): Payer: Self-pay | Admitting: *Deleted

## 2017-10-29 DIAGNOSIS — K922 Gastrointestinal hemorrhage, unspecified: Secondary | ICD-10-CM

## 2017-10-29 DIAGNOSIS — K921 Melena: Secondary | ICD-10-CM

## 2017-10-29 DIAGNOSIS — R7401 Elevation of levels of liver transaminase levels: Secondary | ICD-10-CM

## 2017-10-29 DIAGNOSIS — R740 Nonspecific elevation of levels of transaminase and lactic acid dehydrogenase [LDH]: Secondary | ICD-10-CM

## 2017-11-22 ENCOUNTER — Ambulatory Visit (INDEPENDENT_AMBULATORY_CARE_PROVIDER_SITE_OTHER): Payer: Medicare Other | Admitting: Otolaryngology

## 2017-12-18 ENCOUNTER — Other Ambulatory Visit (INDEPENDENT_AMBULATORY_CARE_PROVIDER_SITE_OTHER): Payer: Self-pay | Admitting: *Deleted

## 2017-12-18 ENCOUNTER — Encounter (INDEPENDENT_AMBULATORY_CARE_PROVIDER_SITE_OTHER): Payer: Self-pay | Admitting: *Deleted

## 2017-12-18 ENCOUNTER — Encounter (INDEPENDENT_AMBULATORY_CARE_PROVIDER_SITE_OTHER): Payer: Self-pay | Admitting: Internal Medicine

## 2017-12-18 ENCOUNTER — Ambulatory Visit (INDEPENDENT_AMBULATORY_CARE_PROVIDER_SITE_OTHER): Payer: Medicare Other | Admitting: Internal Medicine

## 2017-12-18 VITALS — BP 110/70 | HR 66 | Temp 98.5°F | Resp 18 | Ht 68.0 in | Wt 177.9 lb

## 2017-12-18 DIAGNOSIS — K7031 Alcoholic cirrhosis of liver with ascites: Secondary | ICD-10-CM | POA: Diagnosis not present

## 2017-12-18 DIAGNOSIS — K7010 Alcoholic hepatitis without ascites: Secondary | ICD-10-CM | POA: Diagnosis not present

## 2017-12-18 NOTE — Patient Instructions (Addendum)
Upper abdominal ultrasound to be scheduled. Take Maxide on as-needed basis but no more than 3 times a week. Remember you have to quit drinking alcohol altogether. LFTs in 8 weeks.  Office will call.

## 2017-12-18 NOTE — Progress Notes (Signed)
Presenting complaint;  Follow-up for chronic liver disease.  Database and subjective:  Arthur Johnson is a 52 year old Caucasian male who is here for scheduled visit.  He was last seen in December 2018.  He was hospitalized In November last year for GI bleed and anemia.  During that hospitalization he was also diagnosed with decompensated alcoholic cirrhosis.  He had ascites and elevated transaminases.  Regarding GI bleed he underwent full work-up with EGD colonoscopy and small bowel given capsule study.  He had portal hypertensive gastropathy small bowel varices without stigmata of bleeding and colonic diverticulosis.  He was doing better when he was seen on 06/07/2017.  He had lost 11 pounds since his hospitalization which was felt to be due to decrease in ascites.  He has no complaints.  He says he has very good appetite.  He states he can never eat enough food.  He remains on low-salt diet.  He is having 2-3 bowel movements daily.  He denies melena or rectal bleeding.  He has chronic pain in his back legs and feet and as a result not able to do much physical activity but he is trying to walk some.  He also does exercises while sitting or lying supine so that he would not lose muscle mass. He continues to drink beer.  He states he had 4 beers a day or 2 days before his recent blood work.  He states he is drinking anywhere from 12 to 18 cans of beer a month.   Current Medications: Outpatient Encounter Medications as of 12/18/2017  Medication Sig  . ANORO ELLIPTA 62.5-25 MCG/INH AEPB Inhale 1 puff into the lungs daily.   . fluticasone (FLONASE) 50 MCG/ACT nasal spray Place 1 spray into both nostrils daily.   . Multiple Vitamin (MULTIVITAMIN WITH MINERALS) TABS tablet Take 1 tablet by mouth daily.  . pantoprazole (PROTONIX) 20 MG tablet Take 20 mg by mouth daily.  Marland Kitchen triamterene-hydrochlorothiazide (MAXZIDE-25) 37.5-25 MG tablet Take 1 tablet by mouth daily.   . nadolol (CORGARD) 20 MG tablet Take 1 tablet  (20 mg total) by mouth daily.  Marland Kitchen spironolactone (ALDACTONE) 50 MG tablet Take 1 tablet (50 mg total) by mouth daily.  . [DISCONTINUED] VENTOLIN HFA 108 (90 Base) MCG/ACT inhaler Inhale 1-2 puffs into the lungs every 6 (six) hours as needed.   No facility-administered encounter medications on file as of 12/18/2017.      Objective: Blood pressure 110/70, pulse 66, temperature 98.5 F (36.9 C), temperature source Oral, resp. rate 18, height 5\' 8"  (1.727 m), weight 177 lb 14.4 oz (80.7 kg). Patient is alert and in no acute distress. He does not have asterixis. Conjunctiva is pink. Sclera is nonicteric Oropharyngeal mucosa is normal. No neck masses or thyromegaly noted. Cardiac exam with regular rhythm normal S1 and S2. No murmur or gallop noted. Lungs are clear to auscultation. Abdomen is protuberant but soft and nontender.  Shifting dullness is absent.  Spleen is not palpable but he has hepatomegaly with hard nodular liver with prominent left lobe as well as right lobe. No LE edema or clubbing noted.  Labs/studies Results:  Recent Labs    January 07, 2018 0900  WBC 7.4  HGB 14.9  HCT 42.1  PLT 197    BMET  Recent Labs    2018/01/07 0900  NA 129*  K 4.2  CL 92*  CO2 28  GLUCOSE 134*  BUN 5*  CREATININE 0.54*  CALCIUM 9.0    LFT  Recent Labs    2018-01-07  0900  PROT 7.2  AST 95*  ALT 25  BILITOT 2.0*    Chest CT from 10/24/2017 reviewed.  No evidence of perihepatic ascites  Assessment:  #1.  Alcoholic liver disease.  Patient has stigmata of alcoholic cirrhosis and he also has elevated AST with normal ALT consistent with ongoing inflammation or alcoholic hepatitis.  He continues to drink intermittently.  He will decompensate in near future unless he quits drinking altogether. He had ascites when he presented initially but no ascites noted at least across upper abdomen on recent chest CT. EGD in November 2018 was negative for esophageal or gastric varices but small bowel given  capsule study showed small bowel varices without bleeding.  #2.  History of anemia secondary GI bleed.  He was evaluated while he was hospitalized in November 2018.  EGD revealed portal gastropathy and bulbar duodenitis.  Colonoscopy revealed diverticulosis and  given capsule study revealed small bowel varices without bleeding.  He possibly bled from colonic diverticulosis or varices.  His hemoglobin is now normal.  #3.  Mild hyponatremia secondary to diuretic therapy.  He does not have evidence of fluid third spacing or ascites.  Therefore Spironolactone will be discontinued and Maxide used on as-needed basis.  Plan:  Lab to do AFP on serum from yesterday. Discontinue Spironolactone. Use Maxide on as-needed basis. Abdominal ultrasound for [place] screening. Once again patient asked that he must quit drinking alcohol altogether. LFTs and metabolic 7 in 8 weeks. Office visit in 6 months.  Addendum: Alpha-fetoprotein is 6.3 which is just above upper limit of normal. Ultrasound is pending. AFP will be repeated in 6 months.

## 2017-12-19 LAB — COMPREHENSIVE METABOLIC PANEL
AG RATIO: 1.1 (calc) (ref 1.0–2.5)
ALKALINE PHOSPHATASE (APISO): 112 U/L (ref 40–115)
ALT: 25 U/L (ref 9–46)
AST: 95 U/L — AB (ref 10–35)
Albumin: 3.8 g/dL (ref 3.6–5.1)
BUN / CREAT RATIO: 9 (calc) (ref 6–22)
BUN: 5 mg/dL — ABNORMAL LOW (ref 7–25)
CHLORIDE: 92 mmol/L — AB (ref 98–110)
CO2: 28 mmol/L (ref 20–32)
CREATININE: 0.54 mg/dL — AB (ref 0.70–1.33)
Calcium: 9 mg/dL (ref 8.6–10.3)
GLOBULIN: 3.4 g/dL (ref 1.9–3.7)
GLUCOSE: 134 mg/dL — AB (ref 65–99)
Potassium: 4.2 mmol/L (ref 3.5–5.3)
Sodium: 129 mmol/L — ABNORMAL LOW (ref 135–146)
Total Bilirubin: 2 mg/dL — ABNORMAL HIGH (ref 0.2–1.2)
Total Protein: 7.2 g/dL (ref 6.1–8.1)

## 2017-12-19 LAB — TEST AUTHORIZATION

## 2017-12-19 LAB — CBC
HEMATOCRIT: 42.1 % (ref 38.5–50.0)
Hemoglobin: 14.9 g/dL (ref 13.2–17.1)
MCH: 34.2 pg — ABNORMAL HIGH (ref 27.0–33.0)
MCHC: 35.4 g/dL (ref 32.0–36.0)
MCV: 96.6 fL (ref 80.0–100.0)
MPV: 9.7 fL (ref 7.5–12.5)
Platelets: 197 10*3/uL (ref 140–400)
RBC: 4.36 10*6/uL (ref 4.20–5.80)
RDW: 12.7 % (ref 11.0–15.0)
WBC: 7.4 10*3/uL (ref 3.8–10.8)

## 2017-12-19 LAB — AFP TUMOR MARKER: AFP TUMOR MARKER: 6.3 ng/mL — AB (ref <6.1)

## 2017-12-25 ENCOUNTER — Ambulatory Visit (HOSPITAL_COMMUNITY)
Admission: RE | Admit: 2017-12-25 | Discharge: 2017-12-25 | Disposition: A | Discharge status: Home / Self Care | Payer: Medicare Other | Source: Ambulatory Visit | Attending: Internal Medicine | Admitting: Internal Medicine

## 2017-12-25 DIAGNOSIS — K7031 Alcoholic cirrhosis of liver with ascites: Secondary | ICD-10-CM | POA: Diagnosis not present

## 2017-12-31 ENCOUNTER — Other Ambulatory Visit (INDEPENDENT_AMBULATORY_CARE_PROVIDER_SITE_OTHER): Payer: Self-pay | Admitting: *Deleted

## 2017-12-31 DIAGNOSIS — R740 Nonspecific elevation of levels of transaminase and lactic acid dehydrogenase [LDH]: Principal | ICD-10-CM

## 2017-12-31 DIAGNOSIS — R7401 Elevation of levels of liver transaminase levels: Secondary | ICD-10-CM

## 2018-01-14 ENCOUNTER — Other Ambulatory Visit (INDEPENDENT_AMBULATORY_CARE_PROVIDER_SITE_OTHER): Payer: Self-pay | Admitting: *Deleted

## 2018-01-14 ENCOUNTER — Encounter (INDEPENDENT_AMBULATORY_CARE_PROVIDER_SITE_OTHER): Payer: Self-pay | Admitting: *Deleted

## 2018-01-14 DIAGNOSIS — R740 Nonspecific elevation of levels of transaminase and lactic acid dehydrogenase [LDH]: Principal | ICD-10-CM

## 2018-01-14 DIAGNOSIS — R7401 Elevation of levels of liver transaminase levels: Secondary | ICD-10-CM

## 2018-04-13 ENCOUNTER — Observation Stay (HOSPITAL_BASED_OUTPATIENT_CLINIC_OR_DEPARTMENT_OTHER)
Admission: EM | Admit: 2018-04-13 | Discharge: 2018-04-14 | Discharge status: Against Medical Advice | Payer: Medicare Other | Source: Home / Self Care | Attending: Emergency Medicine | Admitting: Emergency Medicine

## 2018-04-13 ENCOUNTER — Encounter (HOSPITAL_COMMUNITY): Payer: Self-pay | Admitting: Emergency Medicine

## 2018-04-13 DIAGNOSIS — G8929 Other chronic pain: Secondary | ICD-10-CM

## 2018-04-13 DIAGNOSIS — J449 Chronic obstructive pulmonary disease, unspecified: Secondary | ICD-10-CM

## 2018-04-13 DIAGNOSIS — R945 Abnormal results of liver function studies: Secondary | ICD-10-CM | POA: Insufficient documentation

## 2018-04-13 DIAGNOSIS — Z79899 Other long term (current) drug therapy: Secondary | ICD-10-CM | POA: Insufficient documentation

## 2018-04-13 DIAGNOSIS — K644 Residual hemorrhoidal skin tags: Secondary | ICD-10-CM | POA: Insufficient documentation

## 2018-04-13 DIAGNOSIS — K766 Portal hypertension: Secondary | ICD-10-CM | POA: Insufficient documentation

## 2018-04-13 DIAGNOSIS — I8510 Secondary esophageal varices without bleeding: Secondary | ICD-10-CM

## 2018-04-13 DIAGNOSIS — K648 Other hemorrhoids: Secondary | ICD-10-CM

## 2018-04-13 DIAGNOSIS — E871 Hypo-osmolality and hyponatremia: Secondary | ICD-10-CM | POA: Diagnosis present

## 2018-04-13 DIAGNOSIS — M549 Dorsalgia, unspecified: Secondary | ICD-10-CM | POA: Insufficient documentation

## 2018-04-13 DIAGNOSIS — I951 Orthostatic hypotension: Secondary | ICD-10-CM | POA: Insufficient documentation

## 2018-04-13 DIAGNOSIS — Z823 Family history of stroke: Secondary | ICD-10-CM | POA: Insufficient documentation

## 2018-04-13 DIAGNOSIS — Z6826 Body mass index (BMI) 26.0-26.9, adult: Secondary | ICD-10-CM | POA: Insufficient documentation

## 2018-04-13 DIAGNOSIS — K625 Hemorrhage of anus and rectum: Secondary | ICD-10-CM | POA: Diagnosis not present

## 2018-04-13 DIAGNOSIS — K3189 Other diseases of stomach and duodenum: Secondary | ICD-10-CM

## 2018-04-13 DIAGNOSIS — Z86010 Personal history of colonic polyps: Secondary | ICD-10-CM

## 2018-04-13 DIAGNOSIS — K219 Gastro-esophageal reflux disease without esophagitis: Secondary | ICD-10-CM | POA: Insufficient documentation

## 2018-04-13 DIAGNOSIS — K7031 Alcoholic cirrhosis of liver with ascites: Secondary | ICD-10-CM

## 2018-04-13 DIAGNOSIS — E43 Unspecified severe protein-calorie malnutrition: Secondary | ICD-10-CM

## 2018-04-13 DIAGNOSIS — F17210 Nicotine dependence, cigarettes, uncomplicated: Secondary | ICD-10-CM

## 2018-04-13 HISTORY — DX: Gastrointestinal hemorrhage, unspecified: K92.2

## 2018-04-13 HISTORY — DX: Unspecified severe protein-calorie malnutrition: E43

## 2018-04-13 HISTORY — DX: Other ascites: R18.8

## 2018-04-13 HISTORY — DX: Unspecified cirrhosis of liver: K74.60

## 2018-04-13 HISTORY — DX: Benign neoplasm of colon, unspecified: D12.6

## 2018-04-13 HISTORY — DX: Diverticulitis of intestine, part unspecified, without perforation or abscess without bleeding: K57.92

## 2018-04-13 LAB — COMPREHENSIVE METABOLIC PANEL
ALBUMIN: 2.8 g/dL — AB (ref 3.5–5.0)
ALT: 37 U/L (ref 0–44)
AST: 105 U/L — AB (ref 15–41)
Alkaline Phosphatase: 135 U/L — ABNORMAL HIGH (ref 38–126)
Anion gap: 11 (ref 5–15)
BUN: 8 mg/dL (ref 6–20)
CHLORIDE: 93 mmol/L — AB (ref 98–111)
CO2: 23 mmol/L (ref 22–32)
Calcium: 7.9 mg/dL — ABNORMAL LOW (ref 8.9–10.3)
Creatinine, Ser: 0.5 mg/dL — ABNORMAL LOW (ref 0.61–1.24)
GFR calc Af Amer: >60 mL/min (ref >60)
GFR calc non Af Amer: >60 mL/min (ref >60)
GLUCOSE: 154 mg/dL — AB (ref 70–99)
POTASSIUM: 4.8 mmol/L (ref 3.5–5.1)
SODIUM: 127 mmol/L — AB (ref 135–145)
Total Bilirubin: 3.1 mg/dL — ABNORMAL HIGH (ref 0.3–1.2)
Total Protein: 6.6 g/dL (ref 6.5–8.1)

## 2018-04-13 LAB — TROPONIN I: Troponin I: <0.03 ng/mL (ref <0.03)

## 2018-04-13 LAB — CBC
HEMATOCRIT: 34.8 % — AB (ref 39.0–52.0)
HEMOGLOBIN: 11.8 g/dL — AB (ref 13.0–17.0)
MCH: 34.6 pg — AB (ref 26.0–34.0)
MCHC: 33.9 g/dL (ref 30.0–36.0)
MCV: 102.1 fL — ABNORMAL HIGH (ref 80.0–100.0)
Platelets: 170 10*3/uL (ref 150–400)
RBC: 3.41 MIL/uL — AB (ref 4.22–5.81)
RDW: 15.2 % (ref 11.5–15.5)
WBC: 10.7 10*3/uL — ABNORMAL HIGH (ref 4.0–10.5)
nRBC: 0 % (ref 0.0–0.2)

## 2018-04-13 LAB — TYPE AND SCREEN
ABO/RH(D): A POS
Antibody Screen: NEGATIVE

## 2018-04-13 LAB — PROTIME-INR
INR: 1.24
PROTHROMBIN TIME: 15.5 s — AB (ref 11.4–15.2)

## 2018-04-13 MED ORDER — FOLIC ACID 1 MG PO TABS: 1.0000 mg | ORAL_TABLET | Freq: Every day | ORAL | Status: DC

## 2018-04-13 MED ORDER — SODIUM CHLORIDE 0.9 % IV SOLN
1.0000 g | INTRAVENOUS | Status: DC
  Administered 2018-04-13: 1 g via INTRAVENOUS
  Filled 2018-04-13 (×3): qty 10

## 2018-04-13 MED ORDER — SODIUM CHLORIDE 0.9 % IV SOLN
INTRAVENOUS | Status: DC | PRN
  Administered 2018-04-13: 250 mL via INTRAVENOUS

## 2018-04-13 MED ORDER — CHLORDIAZEPOXIDE HCL 25 MG PO CAPS: 25.0000 mg | ORAL_CAPSULE | Freq: Every day | ORAL | Status: DC

## 2018-04-13 MED ORDER — CHLORDIAZEPOXIDE HCL 25 MG PO CAPS
50.0000 mg | ORAL_CAPSULE | Freq: Every day | ORAL | Status: DC
  Filled 2018-04-13: qty 2

## 2018-04-13 MED ORDER — SODIUM CHLORIDE 0.9 % IV SOLN
50.0000 ug/h | INTRAVENOUS | Status: DC
  Administered 2018-04-13: 50 ug/h via INTRAVENOUS
  Filled 2018-04-13 (×5): qty 1

## 2018-04-13 MED ORDER — OCTREOTIDE LOAD VIA INFUSION
50.0000 ug | Freq: Once | INTRAVENOUS | Status: AC
  Administered 2018-04-13: 50 ug via INTRAVENOUS
  Filled 2018-04-13: qty 25

## 2018-04-13 MED ORDER — FOLIC ACID 1 MG PO TABS
1.0000 mg | ORAL_TABLET | Freq: Every day | ORAL | Status: DC
  Administered 2018-04-13: 1 mg via ORAL
  Filled 2018-04-13 (×2): qty 1

## 2018-04-13 MED ORDER — NADOLOL 20 MG PO TABS
20.0000 mg | ORAL_TABLET | Freq: Every day | ORAL | Status: DC
  Filled 2018-04-13 (×2): qty 1

## 2018-04-13 MED ORDER — ADULT MULTIVITAMIN W/MINERALS CH
1.0000 | ORAL_TABLET | Freq: Every day | ORAL | Status: DC
  Administered 2018-04-13: 1 via ORAL
  Filled 2018-04-13 (×2): qty 1

## 2018-04-13 MED ORDER — VITAMIN B-1 100 MG PO TABS
100.0000 mg | ORAL_TABLET | Freq: Every day | ORAL | Status: DC
  Administered 2018-04-13: 100 mg via ORAL
  Filled 2018-04-13 (×2): qty 1

## 2018-04-13 MED ORDER — OCTREOTIDE ACETATE 500 MCG/ML IJ SOLN
INTRAMUSCULAR | Status: AC
  Filled 2018-04-13: qty 1

## 2018-04-13 MED ORDER — SODIUM CHLORIDE 0.9 % IV BOLUS
1000.0000 mL | Freq: Once | INTRAVENOUS | Status: AC
  Administered 2018-04-13: 1000 mL via INTRAVENOUS

## 2018-04-13 MED ORDER — LORAZEPAM 1 MG PO TABS: 1.0000 mg | ORAL_TABLET | Freq: Four times a day (QID) | ORAL | Status: DC | PRN

## 2018-04-13 MED ORDER — SODIUM CHLORIDE 0.9 % IV SOLN: INTRAVENOUS | Status: DC

## 2018-04-13 MED ORDER — FAMOTIDINE IN NACL 20-0.9 MG/50ML-% IV SOLN
20.0000 mg | Freq: Once | INTRAVENOUS | Status: AC
  Administered 2018-04-13: 20 mg via INTRAVENOUS
  Filled 2018-04-13: qty 50

## 2018-04-13 MED ORDER — ADULT MULTIVITAMIN W/MINERALS CH: 1.0000 | ORAL_TABLET | Freq: Every day | ORAL | Status: DC

## 2018-04-13 MED ORDER — THIAMINE HCL 100 MG/ML IJ SOLN: 100.0000 mg | Freq: Every day | INTRAMUSCULAR | Status: DC

## 2018-04-13 MED ORDER — LORAZEPAM 2 MG/ML IJ SOLN: 1.0000 mg | Freq: Four times a day (QID) | INTRAMUSCULAR | Status: DC | PRN

## 2018-04-13 MED ORDER — UMECLIDINIUM-VILANTEROL 62.5-25 MCG/INH IN AEPB
1.0000 | INHALATION_SPRAY | Freq: Every day | RESPIRATORY_TRACT | Status: DC
  Filled 2018-04-13: qty 14

## 2018-04-13 MED ORDER — FAMOTIDINE IN NACL 20-0.9 MG/50ML-% IV SOLN
20.0000 mg | Freq: Two times a day (BID) | INTRAVENOUS | Status: DC
  Filled 2018-04-13: qty 50

## 2018-04-13 MED ORDER — SODIUM CHLORIDE 0.9 % IV SOLN
INTRAVENOUS | Status: DC
  Administered 2018-04-13: via INTRAVENOUS

## 2018-04-13 MED ORDER — SPIRONOLACTONE 25 MG PO TABS
50.0000 mg | ORAL_TABLET | Freq: Every day | ORAL | Status: DC
  Filled 2018-04-13: qty 2

## 2018-04-13 NOTE — Progress Notes (Signed)
Patient Name: Arthur Johnson, male   DOB: 12-Jan-1966, 52 y.o.  MRN: [id number]  Sign out from EDP: Pt with history of EtOH abuse here with bright red bleeding. Has orthostasis with standing BP at 78/47. Hg stable. NP from GI has consulted on patient.  Truett Mainland, DO 04/13/2018 6:42 PM
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
## 2018-04-13 NOTE — ED Triage Notes (Signed)
Pt states he began having dark red blood in stool a few hours ago.  States feels like gas cramps which is not bad per pt.

## 2018-04-14 ENCOUNTER — Encounter (HOSPITAL_COMMUNITY): Payer: Self-pay | Admitting: Anesthesiology

## 2018-04-14 ENCOUNTER — Observation Stay (HOSPITAL_COMMUNITY): Payer: Medicare Other | Admitting: Anesthesiology

## 2018-04-14 ENCOUNTER — Encounter (HOSPITAL_COMMUNITY)
Admission: EM | Discharge status: Against Medical Advice | Payer: Self-pay | Source: Home / Self Care | Attending: Emergency Medicine

## 2018-04-14 ENCOUNTER — Other Ambulatory Visit: Payer: Self-pay

## 2018-04-14 ENCOUNTER — Encounter (HOSPITAL_COMMUNITY): Payer: Self-pay | Admitting: *Deleted

## 2018-04-14 DIAGNOSIS — R945 Abnormal results of liver function studies: Secondary | ICD-10-CM | POA: Diagnosis not present

## 2018-04-14 DIAGNOSIS — I951 Orthostatic hypotension: Secondary | ICD-10-CM | POA: Diagnosis not present

## 2018-04-14 DIAGNOSIS — K766 Portal hypertension: Secondary | ICD-10-CM

## 2018-04-14 DIAGNOSIS — K625 Hemorrhage of anus and rectum: Secondary | ICD-10-CM | POA: Diagnosis not present

## 2018-04-14 DIAGNOSIS — I8500 Esophageal varices without bleeding: Secondary | ICD-10-CM | POA: Diagnosis not present

## 2018-04-14 DIAGNOSIS — E871 Hypo-osmolality and hyponatremia: Secondary | ICD-10-CM | POA: Diagnosis not present

## 2018-04-14 DIAGNOSIS — K3189 Other diseases of stomach and duodenum: Secondary | ICD-10-CM

## 2018-04-14 HISTORY — PX: ESOPHAGOGASTRODUODENOSCOPY (EGD) WITH PROPOFOL: SHX5813

## 2018-04-14 LAB — HEMOGLOBIN A1C
HEMOGLOBIN A1C: 5.1 % (ref 4.8–5.6)
Mean Plasma Glucose: 99.67 mg/dL

## 2018-04-14 LAB — COMPREHENSIVE METABOLIC PANEL
ALK PHOS: 98 U/L (ref 38–126)
ALT: 33 U/L (ref 0–44)
ANION GAP: 7 (ref 5–15)
AST: 82 U/L — ABNORMAL HIGH (ref 15–41)
Albumin: 2.5 g/dL — ABNORMAL LOW (ref 3.5–5.0)
BILIRUBIN TOTAL: 3.9 mg/dL — AB (ref 0.3–1.2)
BUN: 14 mg/dL (ref 6–20)
CALCIUM: 7.6 mg/dL — AB (ref 8.9–10.3)
CO2: 23 mmol/L (ref 22–32)
Chloride: 100 mmol/L (ref 98–111)
Creatinine, Ser: 0.69 mg/dL (ref 0.61–1.24)
GFR calc Af Amer: >60 mL/min (ref >60)
GFR calc non Af Amer: >60 mL/min (ref >60)
GLUCOSE: 159 mg/dL — AB (ref 70–99)
POTASSIUM: 4.1 mmol/L (ref 3.5–5.1)
Sodium: 130 mmol/L — ABNORMAL LOW (ref 135–145)
TOTAL PROTEIN: 5.6 g/dL — AB (ref 6.5–8.1)

## 2018-04-14 LAB — CBC
HCT: 26.5 % — ABNORMAL LOW (ref 39.0–52.0)
HEMATOCRIT: 27.4 % — AB (ref 39.0–52.0)
HEMOGLOBIN: 9.1 g/dL — AB (ref 13.0–17.0)
Hemoglobin: 9.4 g/dL — ABNORMAL LOW (ref 13.0–17.0)
MCH: 34.7 pg — AB (ref 26.0–34.0)
MCH: 36 pg — AB (ref 26.0–34.0)
MCHC: 34.3 g/dL (ref 30.0–36.0)
MCHC: 34.3 g/dL (ref 30.0–36.0)
MCV: 101.1 fL — AB (ref 80.0–100.0)
MCV: 104.7 fL — ABNORMAL HIGH (ref 80.0–100.0)
PLATELETS: 149 10*3/uL — AB (ref 150–400)
Platelets: 137 10*3/uL — ABNORMAL LOW (ref 150–400)
RBC: 2.71 MIL/uL — ABNORMAL LOW (ref 4.22–5.81)
RBC: 2.53 MIL/uL — AB (ref 4.22–5.81)
RDW: 14.9 % (ref 11.5–15.5)
RDW: 14.9 % (ref 11.5–15.5)
WBC: 10.3 10*3/uL (ref 4.0–10.5)
WBC: 11.9 10*3/uL — AB (ref 4.0–10.5)
nRBC: 0 % (ref 0.0–0.2)
nRBC: 0 % (ref 0.0–0.2)

## 2018-04-14 LAB — TROPONIN I

## 2018-04-14 LAB — PROTIME-INR
INR: 1.41
Prothrombin Time: 17.1 seconds — ABNORMAL HIGH (ref 11.4–15.2)

## 2018-04-14 SURGERY — ESOPHAGOGASTRODUODENOSCOPY (EGD) WITH PROPOFOL
Anesthesia: Monitor Anesthesia Care

## 2018-04-14 SURGERY — ESOPHAGOGASTRODUODENOSCOPY (EGD) WITH PROPOFOL
Anesthesia: General

## 2018-04-14 MED ORDER — PROMETHAZINE HCL 25 MG/ML IJ SOLN: 6.2500 mg | INTRAMUSCULAR | Status: DC | PRN

## 2018-04-14 MED ORDER — PANTOPRAZOLE SODIUM 40 MG PO TBEC
40.0000 mg | DELAYED_RELEASE_TABLET | Freq: Every day | ORAL | Status: DC
  Filled 2018-04-14: qty 1

## 2018-04-14 MED ORDER — PROPOFOL 10 MG/ML IV BOLUS
INTRAVENOUS | Status: AC
  Filled 2018-04-14: qty 20

## 2018-04-14 MED ORDER — INFLUENZA VAC SPLIT QUAD 0.5 ML IM SUSY: 0.5000 mL | PREFILLED_SYRINGE | INTRAMUSCULAR | Status: DC

## 2018-04-14 MED ORDER — HYDROMORPHONE HCL 1 MG/ML IJ SOLN: 0.2500 mg | INTRAMUSCULAR | Status: DC | PRN

## 2018-04-14 MED ORDER — KETAMINE HCL 10 MG/ML IJ SOLN
INTRAMUSCULAR | Status: AC
  Filled 2018-04-14: qty 1

## 2018-04-14 MED ORDER — LACTATED RINGERS IV SOLN: INTRAVENOUS | Status: DC

## 2018-04-14 MED ORDER — HYDROCODONE-ACETAMINOPHEN 7.5-325 MG PO TABS: 1.0000 | ORAL_TABLET | Freq: Once | ORAL | Status: DC | PRN

## 2018-04-14 MED ORDER — KETAMINE HCL 10 MG/ML IJ SOLN
INTRAMUSCULAR | Status: DC | PRN
  Administered 2018-04-14: 15 mg via INTRAVENOUS

## 2018-04-14 MED ORDER — PROPOFOL 10 MG/ML IV BOLUS
INTRAVENOUS | Status: DC | PRN
  Administered 2018-04-14: 200 mg via INTRAVENOUS

## 2018-04-14 MED ORDER — NADOLOL 40 MG PO TABS
40.0000 mg | ORAL_TABLET | Freq: Every day | ORAL | Status: DC
  Filled 2018-04-14 (×2): qty 1

## 2018-04-14 MED ORDER — PNEUMOCOCCAL VAC POLYVALENT 25 MCG/0.5ML IJ INJ: 0.5000 mL | INJECTION | INTRAMUSCULAR | Status: DC

## 2018-04-14 MED ORDER — LIDOCAINE HCL (PF) 1 % IJ SOLN
INTRAMUSCULAR | Status: AC
  Filled 2018-04-14: qty 5

## 2018-04-14 MED ORDER — PEG 3350-KCL-NA BICARB-NACL 420 G PO SOLR: 2000.0000 mL | Freq: Once | ORAL | Status: DC

## 2018-04-14 MED ORDER — PEG 3350-KCL-NA BICARB-NACL 420 G PO SOLR
2000.0000 mL | Freq: Once | ORAL | Status: DC
  Filled 2018-04-14: qty 4000

## 2018-04-14 NOTE — Op Note (Signed)
[facility] Patient Name: Arthur Johnson Procedure Date: 04/14/2018 10:27 AM MRN: [id number] Date of Birth: Jun 27, 1965 Attending MD: Norvel Richards , MD CSN: [id number] Age: 52 Admit Type: Inpatient Procedure:                Upper GI endoscopy Indications:              Hematochezia Providers:                Norvel Richards, MD, Lurline Del, RN, Nelma Rothman, Technician Referring MD:              Medicines:                Monitored Anesthesia Care Complications:            No immediate complications. Estimated Blood Loss:     Estimated blood loss: none. Procedure:                Pre-Anesthesia Assessment:                           - Prior to the procedure, a History and Physical                            was performed, and patient medications and                            allergies were reviewed. The patient's tolerance of                            previous anesthesia was also reviewed. The risks                            and benefits of the procedure and the sedation                            options and risks were discussed with the patient.                            All questions were answered, and informed consent                            was obtained. Prior Anticoagulants: The patient has                            taken no previous anticoagulant or antiplatelet                            agents. ASA Grade Assessment: III - A patient with                            severe systemic disease. After reviewing the risks  and benefits, the patient was deemed in                            satisfactory condition to undergo the procedure.                           After obtaining informed consent, the endoscope was                            passed under direct vision. Throughout the                            procedure, the patient's blood pressure, pulse, and                            oxygen saturations were  monitored continuously. The                            [id number]-H190 ([id number]) scope was introduced through the                            mouth, and advanced to the third part of duodenum.                            The upper GI endoscopy was accomplished without                            difficulty. The patient tolerated the procedure                            well. Scope In: 10:09:04 AM Scope Out: 10:16:47 AM Total Procedure Duration: 0 hours 7 minutes 43 seconds  Findings:      Grade II varices were found in the esophagus longest column 10 cm in       length. No bleeding stigmata.      Moderate portal hypertensive gastropathy was found in the stomach. No       ulcer, infiltrating process or gastric varices seen.      The duodenal bulb, second portion of the duodenum and third portion of       the duodenum were normal. Impression:               - Grade II esophageal varices without bleeding                            stigmata.                           - Portal hypertensive gastropathy.                           - Normal duodenal bulb, second portion of the                            duodenum and third portion of the duodenum.                           -  No specimens collected. No suspicious lesion in                            upper GI tract found to account for recent                            bleeding. Multiple endoscopic photographs taken but                            not saved because of technical difficulties with                            equipment. Moderate Sedation:      Moderate (conscious) sedation was personally administered by an       anesthesia professional. The following parameters were monitored: oxygen       saturation, heart rate, blood pressure, respiratory rate, EKG, adequacy       of pulmonary ventilation, and response to care. Recommendation:           - Return patient to hospital ward for ongoing care.                            Discontinue famotidine.  Discontinue octreotide.                            Begin Protonix 40 mg daily. Begin Nadolol 40 mg                            daily as primary prophylaxis against first variceal                            hemorrhage. Target heart rate mid 50s. Clear liquid                            diet. Diagnostic colonoscopy tomorrow (discussed                            risk and benefits of this avenue of evaluation with                            the patient prior to EGD today). Continue                            antibiotics for 5 more days assuming no further                            bleeding. Procedure Code(s):        --- Professional ---                           [phone], Esophagogastroduodenoscopy, flexible,                            transoral; diagnostic, including collection of  specimen(s) by brushing or washing, when performed                            (separate procedure) Diagnosis Code(s):        --- Professional ---                           I85.00, Esophageal varices without bleeding                           K76.6, Portal hypertension                           K31.89, Other diseases of stomach and duodenum                           K92.1, Melena (includes Hematochezia) CPT copyright 2018 [employer]. All rights reserved. The codes documented in this report are preliminary and upon coder review may  be revised to meet current compliance requirements. Cristopher Estimable. Rourk, MD Norvel Richards, MD 04/14/2018 10:44:09 AM This report has been signed electronically. Number of Addenda: 0

## 2018-04-14 NOTE — Progress Notes (Addendum)
Reviewed risks and benefits of EGD with Propofol by Dr. Gala Romney. NPO except meds. No further bleeding since I saw patient yesterday evening. Hgb 9.1, down from 11.8 on admission. INR 1.41 this morning. Discriminant function 22.8. MELD Na 22. Understands risks and benefits of procedure today. No questions. Currently on Rocephin 1 g IV daily and octreotide infusion empirically  Arthur Needs, PhD, ANP-BC [facility]    Agree with above.

## 2018-04-14 NOTE — Anesthesia Postprocedure Evaluation (Signed)
Anesthesia Post Note  Patient: Arthur Johnson  Procedure(s) Performed: ESOPHAGOGASTRODUODENOSCOPY (EGD) WITH PROPOFOL (N/A )  Patient location during evaluation: PACU Anesthesia Type: General Level of consciousness: awake and awake and alert Pain management: pain level controlled Respiratory status: spontaneous breathing Cardiovascular status: blood pressure returned to baseline Postop Assessment: no headache Anesthetic complications: no     Last Vitals:  Vitals:   04/13/18 2035 04/14/18 0502  BP: 107/72 105/71  Pulse: 94 95  Resp: 18 18  Temp: 37.2 C 37.1 C  SpO2: 96% 98%    Last Pain:  Vitals:   04/14/18 0502  TempSrc: Oral  PainSc:                  Lenice Llamas

## 2018-04-14 NOTE — Anesthesia Preprocedure Evaluation (Signed)
Anesthesia Evaluation  Patient identified by MRN, date of birth, ID band Patient awake    Reviewed: Allergy & Precautions, NPO status , Patient's Chart, lab work & pertinent test results  Airway Mallampati: III  TM Distance: >3 FB Neck ROM: Full    Dental no notable dental hx. (+) Teeth Intact, Missing   Pulmonary COPD, Current Smoker,    Pulmonary exam normal breath sounds clear to auscultation       Cardiovascular Exercise Tolerance: Poor hypertension, Pt. on medications negative cardio ROS Normal cardiovascular examI Rhythm:Regular Rate:Normal  States limited ET b/c LBP -prev back surg   Neuro/Psych PSYCHIATRIC DISORDERS negative neurological ROS     GI/Hepatic Neg liver ROS, GERD  Medicated and Controlled,  Endo/Other  negative endocrine ROS  Renal/GU negative Renal ROS  negative genitourinary   Musculoskeletal negative musculoskeletal ROS (+)   Abdominal   Peds negative pediatric ROS (+)  Hematology negative hematology ROS (+)   Anesthesia Other Findings   Reproductive/Obstetrics negative OB ROS                             Anesthesia Physical Anesthesia Plan  ASA: II and emergent  Anesthesia Plan: General   Post-op Pain Management:    Induction: Intravenous  PONV Risk Score and Plan:   Airway Management Planned: Simple Face Mask and Nasal Cannula  Additional Equipment:   Intra-op Plan:   Post-operative Plan:   Informed Consent: I have reviewed the patients History and Physical, chart, labs and discussed the procedure including the risks, benefits and alternatives for the proposed anesthesia with the patient or authorized representative who has indicated his/her understanding and acceptance.   Dental advisory given  Plan Discussed with:   Anesthesia Plan Comments:         Anesthesia Quick Evaluation

## 2018-04-14 NOTE — Addendum Note (Signed)
Addendum  created 04/14/18 1127 by Lenice Llamas, MD   Charge Capture section accepted

## 2018-04-14 NOTE — Discharge Summary (Signed)
Physician Discharge Summary  Arthur Johnson TIR:[id number] DOB: December 29, 1965 DOA: 04/13/2018  PCP: Arthur Gravel, MD  Admit date: 04/13/2018 Discharge date: 04/14/2018  Admitted From:  Home  Disposition: [place].  HE SAYS HE WILL CALL HIS GASTROENTEROLOGIST TO ARRANGE COLONOSCOPY TOMORROW AM.    Discharge Condition: STABLE   CODE STATUS: FULL    Brief Hospitalization Summary: Please see all hospital notes, images, labs for full details of the hospitalization. Brief Admission Hx: [name] a52 y.o.male,w etoh abuse, cirrhosis (etoh), portal gastropathy (mild), diverticulosis, h/o colonic polyp (adenoma, 2018), w h/o EGD/ colonscopy 05/10/2017 apparently presents with c/o rectal bleed x1 (melena) at home. Denies fever, chills, abd pain, n/v, hematemesis, diarrhea, black stool. Pt presented to the ED and found to be orthostatic ? sbp 79.  He was admitted for rectal bleeding.    MDM/Assessment & Plan:   1. Rectal bleeding - concern for upper GI bleeding.  GI consulted and have made plans for EGD this morning.  pT HAD THE EGD DONE AND NO SOURCE FOR BLEEDING WAS FOUND. He was taken off famotidine and octreotide. GI had plans to do AM colonoscopy.  However, patient has abruptly refused and signed out AMA.  he did say that he will call his GI Dr. Laural Johnson to arrange colonoscopy outpatient.    2. Alcoholic cirrhosis with mild portal gastropathy - resumed on home nadolol, spironolactone.  3. Hypotension - BPs improved with IV fluids.  4. Hyponatremia - secondary to chronic liver disease.  5. COPD - resumed home respiratory medications.  6. Alcoholism (chronic) - he was placed on librium by admitting MD.  CIWA in place.    EGD: - Grade II esophageal varices without bleeding stigmata. - Portal hypertensive gastropathy. - Normal duodenal bulb, second portion of the duodenum and third portion of the duodenum. - No specimens collected. No  suspicious lesion in upper GI tract found to account for recent bleeding.  RECOMMENDATIONS FROM GI TEAM - Return patient to hospital ward for ongoing care. Discontinue famotidine. Discontinue octreotide. Begin Protonix 40 mg daily. Begin Nadolol 40 mg daily as primary prophylaxis against first variceal hemorrhage. Target heart rate mid 50s. Clear liquid diet. Diagnostic colonoscopy tomorrow (discussed risk and benefits of this avenue of evaluation with the patient prior to EGD today). Continue antibiotics for 5 more days assuming no further bleeding.  DVT prophylaxis: SCDs Code Status: Full  Family Communication: patient  Disposition Plan: home when medically cleared   Consultants:  GI  Procedures:  EGD 10/27  Discharge Diagnoses:  Principal Problem:   Rectal bleeding Active Problems:   Hyponatremia   Orthostasis   Abnormal liver function  Procedures/Studies:  No results found.   Discharge Exam: Vitals:   04/14/18 1025 04/14/18 1345  BP: 137/78 137/77  Pulse: 91 (!) 122  Resp: 16 17  Temp: 98.8 F (37.1 C) 99.2 F (37.3 C)  SpO2: 97% 96%   Vitals:   04/14/18 0700 04/14/18 0955 04/14/18 1025 04/14/18 1345  BP:  132/80 137/78 137/77  Pulse:  91 91 (!) 122  Resp:  17 16 17   Temp:  98.8 F (37.1 C) 98.8 F (37.1 C) 99.2 F (37.3 C)  TempSrc:  Oral  Oral  SpO2:  96% 97% 96%  Weight: 78.1 kg     Height:         The results of significant diagnostics from this hospitalization (including imaging, microbiology, ancillary and laboratory) are listed below for reference.  Microbiology: No results found for this or any previous visit (from the past 240 hour(s)).   Labs: BNP (last 3 results) No results for input(s): BNP in the last 8760 hours. Basic Metabolic Panel: Recent Labs  Lab 04/13/18 1641 04/14/18 0623  NA 127* 130*  K 4.8 4.1  CL 93* 100  CO2 23 23  GLUCOSE 154* 159*  BUN 8 14  CREATININE 0.50* 0.69  CALCIUM 7.9* 7.6*   Liver  Function Tests: Recent Labs  Lab 04/13/18 1641 04/14/18 0623  AST 105* 82*  ALT 37 33  ALKPHOS 135* 98  BILITOT 3.1* 3.9*  PROT 6.6 5.6*  ALBUMIN 2.8* 2.5*   No results for input(s): LIPASE, AMYLASE in the last 168 hours. No results for input(s): AMMONIA in the last 168 hours. CBC: Recent Labs  Lab 04/13/18 1641 04/13/18 2304 04/14/18 0623  WBC 10.7* 10.3 11.9*  HGB 11.8* 9.4* 9.1*  HCT 34.8* 27.4* 26.5*  MCV 102.1* 101.1* 104.7*  PLT 170 137* 149*   Cardiac Enzymes: Recent Labs  Lab 04/13/18 1900 04/13/18 2304 04/14/18 0623  TROPONINI <0.03 <0.03 <0.03   BNP: Invalid input(s): POCBNP CBG: No results for input(s): GLUCAP in the last 168 hours. D-Dimer No results for input(s): DDIMER in the last 72 hours. Hgb A1c Recent Labs    04/14/18 0623  HGBA1C 5.1   Lipid Profile No results for input(s): CHOL, HDL, LDLCALC, TRIG, CHOLHDL, LDLDIRECT in the last 72 hours. Thyroid function studies No results for input(s): TSH, T4TOTAL, T3FREE, THYROIDAB in the last 72 hours.  Invalid input(s): FREET3 Anemia work up No results for input(s): VITAMINB12, FOLATE, FERRITIN, TIBC, IRON, RETICCTPCT in the last 72 hours. Urinalysis    Component Value Date/Time   COLORURINE YELLOW 04/22/2013 [place] 04/22/2013 1157   LABSPEC <1.005 (L) 04/22/2013 1157   PHURINE 6.0 04/22/2013 1157   GLUCOSEU NEGATIVE 04/22/2013 1157   HGBUR NEGATIVE 04/22/2013 1157   BILIRUBINUR NEGATIVE 04/22/2013 1157   KETONESUR NEGATIVE 04/22/2013 1157   PROTEINUR NEGATIVE 04/22/2013 1157   UROBILINOGEN 0.2 04/22/2013 1157   NITRITE NEGATIVE 04/22/2013 1157   LEUKOCYTESUR NEGATIVE 04/22/2013 1157   Sepsis Labs Invalid input(s): PROCALCITONIN,  WBC,  LACTICIDVEN Microbiology No results found for this or any previous visit (from the past 240 hour(s)).  Time coordinating discharge:   SIGNED:  Irwin Brakeman, MD  [facility] 04/14/2018, 2:39 PM Pager [phone]  [phone]  If 7PM-7AM, please contact night-coverage [email or web address] Password [email or web address]

## 2018-04-14 NOTE — Transfer of Care (Signed)
Immediate Anesthesia Transfer of Care Note  Patient: Arthur Johnson  Procedure(s) Performed: ESOPHAGOGASTRODUODENOSCOPY (EGD) WITH PROPOFOL (N/A )  Patient Location: PACU  Anesthesia Type:General  Level of Consciousness: awake  Airway & Oxygen Therapy: Patient Spontanous Breathing  Post-op Assessment: Report given to RN  Post vital signs: Reviewed and stable  Last Vitals:  Vitals Value Taken Time  BP    Temp    Pulse    Resp    SpO2      Last Pain:  Vitals:   04/14/18 0502  TempSrc: Oral  PainSc:       Patients Stated Pain Goal: 0 (36/46/80 3212)  Complications: No apparent anesthesia complications

## 2018-04-14 NOTE — Progress Notes (Signed)
Pt has signed out [place] stating that he can have an outpt colonoscopy.  Pt advised to call Dr. Laural Golden Monday morning.

## 2018-04-14 NOTE — Progress Notes (Signed)
PROGRESS NOTE    Arthur Johnson  KNL:[id number]  DOB: 01/06/1966  DOA: 04/13/2018 PCP: [name], MD   Brief Admission Hx: Arthur Johnson  is a 52 y.o. male, w etoh abuse, cirrhosis (etoh), portal gastropathy (mild), diverticulosis, h/o colonic polyp (adenoma, 2018), w h/o EGD/ colonscopy 05/10/2017 apparently presents with c/o rectal bleed x1 (melena) at home.  Denies fever, chills, abd pain, n/v, hematemesis, diarrhea, black stool.  Pt presented to the ED and found to be orthostatic ? sbp 79.  He was admitted for rectal bleeding.    MDM/Assessment & Plan:   1. Rectal bleeding - concern for upper GI bleeding.  GI consulted and have made plans for EGD this morning.  Continue current treatment with octrotide and ceftriaxone for now.   2. Alcoholic cirrhosis with mild portal gastropathy - he is on octreotide, pepcid IV, resumed on home nadolol, spironolactone.  3. Hypotension - BPs improved with IV fluids.  4. Hyponatremia - secondary to chronic liver disease.  5. COPD - resumed home respiratory medications.  6. Alcoholism (chronic) - he was placed on librium by admitting MD.  CIWA in place.    DVT prophylaxis: SCDs Code Status: Full  Family Communication: patient  Disposition Plan: home when medically cleared   Consultants:  GI  Procedures:  EGD 10/27  Antimicrobials:  Ceftriaxone 10/26   Subjective: Pt without complaints, says he feels better this morning. No further rectal bleeding seen, no black stools seen.   Objective: Vitals:   04/13/18 1930 04/13/18 2035 04/14/18 0502 04/14/18 0700  BP: 127/79 107/72 105/71   Pulse: 99 94 95   Resp:  18 18   Temp:  98.9 F (37.2 C) 98.7 F (37.1 C)   TempSrc:  Oral Oral   SpO2: 95% 96% 98%   Weight:    78.1 kg  Height:        Intake/Output Summary (Last 24 hours) at 04/14/2018 1029 Last data filed at 04/14/2018 1022 Gross per 24 hour  Intake 919.34 ml  Output 1 ml  Net 918.34 ml   Filed Weights   04/13/18 1611 04/14/18 0700  Weight: 78.9 kg 78.1 kg     REVIEW OF SYSTEMS  As per history otherwise all reviewed and reported negative  Exam:  General exam: awake, alert, chronically ill appearing, NAD.  Respiratory system: Clear. No increased work of breathing. Cardiovascular system: S1 & S2 heard, RRR. No JVD, murmurs, gallops, clicks or pedal edema. Gastrointestinal system: Abdomen is nondistended, soft and nontender. Normal bowel sounds heard. Central nervous system: Alert and oriented. No focal neurological deficits. Extremities: no CCE.  Data Reviewed: Basic Metabolic Panel: Recent Labs  Lab 04/13/18 1641 04/14/18 0623  NA 127* 130*  K 4.8 4.1  CL 93* 100  CO2 23 23  GLUCOSE 154* 159*  BUN 8 14  CREATININE 0.50* 0.69  CALCIUM 7.9* 7.6*   Liver Function Tests: Recent Labs  Lab 04/13/18 1641 04/14/18 0623  AST 105* 82*  ALT 37 33  ALKPHOS 135* 98  BILITOT 3.1* 3.9*  PROT 6.6 5.6*  ALBUMIN 2.8* 2.5*   No results for input(s): LIPASE, AMYLASE in the last 168 hours. No results for input(s): AMMONIA in the last 168 hours. CBC: Recent Labs  Lab 04/13/18 1641 04/13/18 2304 04/14/18 0623  WBC 10.7* 10.3 11.9*  HGB 11.8* 9.4* 9.1*  HCT 34.8* 27.4* 26.5*  MCV 102.1* 101.1* 104.7*  PLT 170 137* 149*   Cardiac Enzymes: Recent Labs  Lab 04/13/18 1900 04/13/18  2304 04/14/18 0623  TROPONINI <0.03 <0.03 <0.03   CBG (last 3)  No results for input(s): GLUCAP in the last 72 hours. No results found for this or any previous visit (from the past 240 hour(s)).   Studies: No results found.   Scheduled Meds: . [MAR Hold] chlordiazePOXIDE  25 mg Oral Daily  . [MAR Hold] chlordiazePOXIDE  50 mg Oral Daily  . [MAR Hold] folic acid  1 mg Oral Daily  . [MAR Hold] Influenza vac split quadrivalent PF  0.5 mL Intramuscular Tomorrow-1000  . [MAR Hold] multivitamin with minerals  1 tablet Oral Daily  . nadolol  40 mg Oral Daily  . pantoprazole  40 mg Oral Daily    . [MAR Hold] pneumococcal 23 valent vaccine  0.5 mL Intramuscular Tomorrow-1000  . [MAR Hold] spironolactone  50 mg Oral Daily  . [MAR Hold] thiamine  100 mg Oral Daily   Or  . [MAR Hold] thiamine  100 mg Intravenous Daily  . [MAR Hold] umeclidinium-vilanterol  1 puff Inhalation Daily   Continuous Infusions: . [MAR Hold] sodium chloride 250 mL (04/13/18 1812)  . sodium chloride    . [MAR Hold] cefTRIAXone (ROCEPHIN)  IV 1 g (04/13/18 2347)    Principal Problem:   Rectal bleeding Active Problems:   Hyponatremia   Orthostasis   Abnormal liver function   Time spent:   Irwin Brakeman, MD, FAAFP [facility] Pager [phone]  If 7PM-7AM, please contact night-coverage [email or web address] Password [email or web address] 04/14/2018, 10:29 AM    LOS: 0 days

## 2018-04-15 ENCOUNTER — Inpatient Hospital Stay (HOSPITAL_COMMUNITY): Payer: Medicare Other

## 2018-04-15 ENCOUNTER — Telehealth: Payer: Self-pay | Admitting: Gastroenterology

## 2018-04-15 ENCOUNTER — Inpatient Hospital Stay (HOSPITAL_COMMUNITY)
Admission: EM | Admit: 2018-04-15 | Discharge: 2018-04-18 | DRG: 377 | Disposition: A | Discharge status: Home / Self Care | Payer: Medicare Other | Attending: Family Medicine | Admitting: Family Medicine

## 2018-04-15 ENCOUNTER — Encounter (HOSPITAL_COMMUNITY): Payer: Self-pay | Admitting: Emergency Medicine

## 2018-04-15 ENCOUNTER — Other Ambulatory Visit: Payer: Self-pay

## 2018-04-15 ENCOUNTER — Ambulatory Visit (HOSPITAL_COMMUNITY): Admit: 2018-04-15 | Payer: Medicare Other | Admitting: Gastroenterology

## 2018-04-15 ENCOUNTER — Encounter (HOSPITAL_COMMUNITY): Payer: Self-pay

## 2018-04-15 DIAGNOSIS — I10 Essential (primary) hypertension: Secondary | ICD-10-CM | POA: Diagnosis present

## 2018-04-15 DIAGNOSIS — F10239 Alcohol dependence with withdrawal, unspecified: Secondary | ICD-10-CM | POA: Diagnosis not present

## 2018-04-15 DIAGNOSIS — K644 Residual hemorrhoidal skin tags: Secondary | ICD-10-CM | POA: Diagnosis present

## 2018-04-15 DIAGNOSIS — R569 Unspecified convulsions: Secondary | ICD-10-CM | POA: Diagnosis present

## 2018-04-15 DIAGNOSIS — F1010 Alcohol abuse, uncomplicated: Secondary | ICD-10-CM

## 2018-04-15 DIAGNOSIS — F10939 Alcohol use, unspecified with withdrawal, unspecified: Secondary | ICD-10-CM

## 2018-04-15 DIAGNOSIS — K3189 Other diseases of stomach and duodenum: Secondary | ICD-10-CM | POA: Diagnosis present

## 2018-04-15 DIAGNOSIS — R402412 Glasgow coma scale score 13-15, at arrival to emergency department: Secondary | ICD-10-CM | POA: Diagnosis present

## 2018-04-15 DIAGNOSIS — M549 Dorsalgia, unspecified: Secondary | ICD-10-CM | POA: Diagnosis present

## 2018-04-15 DIAGNOSIS — R945 Abnormal results of liver function studies: Secondary | ICD-10-CM | POA: Diagnosis present

## 2018-04-15 DIAGNOSIS — D696 Thrombocytopenia, unspecified: Secondary | ICD-10-CM | POA: Diagnosis present

## 2018-04-15 DIAGNOSIS — F10231 Alcohol dependence with withdrawal delirium: Secondary | ICD-10-CM | POA: Diagnosis present

## 2018-04-15 DIAGNOSIS — E876 Hypokalemia: Secondary | ICD-10-CM | POA: Diagnosis present

## 2018-04-15 DIAGNOSIS — Z8719 Personal history of other diseases of the digestive system: Secondary | ICD-10-CM

## 2018-04-15 DIAGNOSIS — D62 Acute posthemorrhagic anemia: Secondary | ICD-10-CM | POA: Diagnosis present

## 2018-04-15 DIAGNOSIS — Z79899 Other long term (current) drug therapy: Secondary | ICD-10-CM

## 2018-04-15 DIAGNOSIS — I8500 Esophageal varices without bleeding: Secondary | ICD-10-CM | POA: Diagnosis present

## 2018-04-15 DIAGNOSIS — I8510 Secondary esophageal varices without bleeding: Secondary | ICD-10-CM | POA: Diagnosis present

## 2018-04-15 DIAGNOSIS — Z720 Tobacco use: Secondary | ICD-10-CM

## 2018-04-15 DIAGNOSIS — K7030 Alcoholic cirrhosis of liver without ascites: Secondary | ICD-10-CM | POA: Diagnosis present

## 2018-04-15 DIAGNOSIS — K219 Gastro-esophageal reflux disease without esophagitis: Secondary | ICD-10-CM | POA: Diagnosis present

## 2018-04-15 DIAGNOSIS — E871 Hypo-osmolality and hyponatremia: Secondary | ICD-10-CM | POA: Diagnosis present

## 2018-04-15 DIAGNOSIS — E43 Unspecified severe protein-calorie malnutrition: Secondary | ICD-10-CM | POA: Diagnosis present

## 2018-04-15 DIAGNOSIS — Z86010 Personal history of colonic polyps: Secondary | ICD-10-CM | POA: Diagnosis not present

## 2018-04-15 DIAGNOSIS — K7010 Alcoholic hepatitis without ascites: Secondary | ICD-10-CM | POA: Diagnosis present

## 2018-04-15 DIAGNOSIS — Z6826 Body mass index (BMI) 26.0-26.9, adult: Secondary | ICD-10-CM

## 2018-04-15 DIAGNOSIS — K7290 Hepatic failure, unspecified without coma: Secondary | ICD-10-CM | POA: Diagnosis present

## 2018-04-15 DIAGNOSIS — Z823 Family history of stroke: Secondary | ICD-10-CM

## 2018-04-15 DIAGNOSIS — K922 Gastrointestinal hemorrhage, unspecified: Principal | ICD-10-CM | POA: Diagnosis present

## 2018-04-15 DIAGNOSIS — K648 Other hemorrhoids: Secondary | ICD-10-CM | POA: Diagnosis present

## 2018-04-15 DIAGNOSIS — J449 Chronic obstructive pulmonary disease, unspecified: Secondary | ICD-10-CM | POA: Diagnosis present

## 2018-04-15 DIAGNOSIS — I951 Orthostatic hypotension: Secondary | ICD-10-CM | POA: Diagnosis present

## 2018-04-15 DIAGNOSIS — G8929 Other chronic pain: Secondary | ICD-10-CM | POA: Diagnosis present

## 2018-04-15 DIAGNOSIS — K625 Hemorrhage of anus and rectum: Secondary | ICD-10-CM | POA: Diagnosis present

## 2018-04-15 DIAGNOSIS — Z9049 Acquired absence of other specified parts of digestive tract: Secondary | ICD-10-CM

## 2018-04-15 DIAGNOSIS — K766 Portal hypertension: Secondary | ICD-10-CM | POA: Diagnosis present

## 2018-04-15 DIAGNOSIS — F17210 Nicotine dependence, cigarettes, uncomplicated: Secondary | ICD-10-CM | POA: Diagnosis present

## 2018-04-15 LAB — HEMOGLOBIN A1C
HEMOGLOBIN A1C: 4.8 % (ref 4.8–5.6)
Mean Plasma Glucose: 91.06 mg/dL

## 2018-04-15 LAB — CBC
HCT: 23.2 % — ABNORMAL LOW (ref 39.0–52.0)
Hemoglobin: 7.9 g/dL — ABNORMAL LOW (ref 13.0–17.0)
MCH: 35.3 pg — AB (ref 26.0–34.0)
MCHC: 34.1 g/dL (ref 30.0–36.0)
MCV: 103.6 fL — AB (ref 80.0–100.0)
PLATELETS: 122 10*3/uL — AB (ref 150–400)
RBC: 2.24 MIL/uL — AB (ref 4.22–5.81)
RDW: 14.8 % (ref 11.5–15.5)
WBC: 8.8 10*3/uL (ref 4.0–10.5)
nRBC: 0 % (ref 0.0–0.2)

## 2018-04-15 LAB — CK: CK TOTAL: 278 U/L (ref 49–397)

## 2018-04-15 LAB — BASIC METABOLIC PANEL
Anion gap: 10 (ref 5–15)
BUN: 15 mg/dL (ref 6–20)
CALCIUM: 8 mg/dL — AB (ref 8.9–10.3)
CO2: 20 mmol/L — AB (ref 22–32)
CREATININE: 0.74 mg/dL (ref 0.61–1.24)
Chloride: 98 mmol/L (ref 98–111)
GFR calc Af Amer: >60 mL/min (ref >60)
GFR calc non Af Amer: >60 mL/min (ref >60)
GLUCOSE: 160 mg/dL — AB (ref 70–99)
Potassium: 3.4 mmol/L — ABNORMAL LOW (ref 3.5–5.1)
Sodium: 128 mmol/L — ABNORMAL LOW (ref 135–145)

## 2018-04-15 LAB — POC OCCULT BLOOD, ED: Fecal Occult Bld: POSITIVE — AB

## 2018-04-15 LAB — CBG MONITORING, ED: Glucose-Capillary: 166 mg/dL — ABNORMAL HIGH (ref 70–99)

## 2018-04-15 SURGERY — COLONOSCOPY WITH PROPOFOL
Anesthesia: Monitor Anesthesia Care

## 2018-04-15 MED ORDER — NADOLOL 20 MG PO TABS
20.0000 mg | ORAL_TABLET | Freq: Every day | ORAL | Status: DC
  Administered 2018-04-16 – 2018-04-18 (×3): 20 mg via ORAL
  Filled 2018-04-15 (×5): qty 1

## 2018-04-15 MED ORDER — THIAMINE HCL 100 MG/ML IJ SOLN: 100.0000 mg | Freq: Every day | INTRAMUSCULAR | Status: DC

## 2018-04-15 MED ORDER — NICOTINE 21 MG/24HR TD PT24
21.0000 mg | MEDICATED_PATCH | Freq: Every day | TRANSDERMAL | Status: DC
  Administered 2018-04-15 – 2018-04-18 (×4): 21 mg via TRANSDERMAL
  Filled 2018-04-15 (×3): qty 1

## 2018-04-15 MED ORDER — ADULT MULTIVITAMIN W/MINERALS CH
1.0000 | ORAL_TABLET | Freq: Every day | ORAL | Status: DC
  Administered 2018-04-16 – 2018-04-18 (×3): 1 via ORAL
  Filled 2018-04-15 (×3): qty 1

## 2018-04-15 MED ORDER — LORAZEPAM 2 MG/ML IJ SOLN
1.0000 mg | Freq: Four times a day (QID) | INTRAMUSCULAR | Status: DC | PRN
  Filled 2018-04-15: qty 1

## 2018-04-15 MED ORDER — LORAZEPAM 1 MG PO TABS
1.0000 mg | ORAL_TABLET | Freq: Four times a day (QID) | ORAL | Status: DC | PRN
  Administered 2018-04-16 (×2): 1 mg via ORAL
  Filled 2018-04-15 (×2): qty 1

## 2018-04-15 MED ORDER — FOLIC ACID 1 MG PO TABS: 1.0000 mg | ORAL_TABLET | Freq: Every day | ORAL | Status: DC

## 2018-04-15 MED ORDER — CHLORDIAZEPOXIDE HCL 25 MG PO CAPS: 25.0000 mg | ORAL_CAPSULE | Freq: Once | ORAL | Status: DC

## 2018-04-15 MED ORDER — LORAZEPAM 2 MG/ML IJ SOLN: 2.0000 mg | INTRAMUSCULAR | Status: DC | PRN

## 2018-04-15 MED ORDER — VITAMIN B-1 100 MG PO TABS
100.0000 mg | ORAL_TABLET | Freq: Every day | ORAL | Status: DC
  Administered 2018-04-16 – 2018-04-18 (×3): 100 mg via ORAL
  Filled 2018-04-15 (×3): qty 1

## 2018-04-15 MED ORDER — ONDANSETRON HCL 4 MG/2ML IJ SOLN: 4.0000 mg | Freq: Four times a day (QID) | INTRAMUSCULAR | Status: DC | PRN

## 2018-04-15 MED ORDER — SPIRONOLACTONE 25 MG PO TABS
50.0000 mg | ORAL_TABLET | Freq: Every day | ORAL | Status: DC
  Administered 2018-04-16 – 2018-04-18 (×3): 50 mg via ORAL
  Filled 2018-04-15 (×3): qty 2

## 2018-04-15 MED ORDER — ACETAMINOPHEN 325 MG PO TABS: 650.0000 mg | ORAL_TABLET | Freq: Four times a day (QID) | ORAL | Status: DC | PRN

## 2018-04-15 MED ORDER — ONDANSETRON HCL 4 MG PO TABS: 4.0000 mg | ORAL_TABLET | Freq: Four times a day (QID) | ORAL | Status: DC | PRN

## 2018-04-15 MED ORDER — FOLIC ACID 1 MG PO TABS
1.0000 mg | ORAL_TABLET | Freq: Every day | ORAL | Status: DC
  Administered 2018-04-16 – 2018-04-18 (×3): 1 mg via ORAL
  Filled 2018-04-15 (×3): qty 1

## 2018-04-15 MED ORDER — NICOTINE 21 MG/24HR TD PT24
MEDICATED_PATCH | TRANSDERMAL | Status: AC
  Filled 2018-04-15: qty 1

## 2018-04-15 MED ORDER — POTASSIUM CHLORIDE IN NACL 20-0.9 MEQ/L-% IV SOLN
INTRAVENOUS | Status: AC
  Administered 2018-04-16: via INTRAVENOUS

## 2018-04-15 MED ORDER — UMECLIDINIUM-VILANTEROL 62.5-25 MCG/INH IN AEPB
1.0000 | INHALATION_SPRAY | Freq: Every day | RESPIRATORY_TRACT | Status: DC
  Administered 2018-04-16 – 2018-04-18 (×3): 1 via RESPIRATORY_TRACT
  Filled 2018-04-15: qty 14

## 2018-04-15 MED ORDER — LORAZEPAM 2 MG/ML IJ SOLN: 2.0000 mg | Freq: Once | INTRAMUSCULAR | Status: DC

## 2018-04-15 MED ORDER — POTASSIUM CHLORIDE CRYS ER 20 MEQ PO TBCR: 40.0000 meq | EXTENDED_RELEASE_TABLET | Freq: Once | ORAL | Status: DC

## 2018-04-15 MED ORDER — PANTOPRAZOLE SODIUM 40 MG PO TBEC
40.0000 mg | DELAYED_RELEASE_TABLET | Freq: Every day | ORAL | Status: DC
  Administered 2018-04-16 – 2018-04-18 (×3): 40 mg via ORAL
  Filled 2018-04-15 (×3): qty 1

## 2018-04-15 MED ORDER — THIAMINE HCL 100 MG/ML IJ SOLN
Freq: Once | INTRAVENOUS | Status: AC
  Administered 2018-04-15: 14:00:00 via INTRAVENOUS
  Filled 2018-04-15: qty 1000

## 2018-04-15 MED ORDER — ACETAMINOPHEN 650 MG RE SUPP: 650.0000 mg | Freq: Four times a day (QID) | RECTAL | Status: DC | PRN

## 2018-04-15 MED ORDER — ADULT MULTIVITAMIN W/MINERALS CH: 1.0000 | ORAL_TABLET | Freq: Every day | ORAL | Status: DC
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
## 2018-04-15 NOTE — Telephone Encounter (Signed)
Left message for patient to call me to schedule OV

## 2018-04-15 NOTE — ED Notes (Signed)
Report called to 300 RN

## 2018-04-15 NOTE — ED Notes (Signed)
Admitting MD at bedside.

## 2018-04-15 NOTE — ED Triage Notes (Signed)
Per ems pt's wife stated that he was just staring off into space for a small amount of time then he put his hand to his head and screamed. Pt does not remember anything

## 2018-04-15 NOTE — Telephone Encounter (Signed)
Inpatient with rectal bleeding over the weekend. History of ETOH cirrhosis. EGD completed "Grade II esophageal varices without bleeding stigmata. Portal hypertensive gastropathy. Normal duodenal bulb, second portion of the duodenum and third portion of the duodenum.No specimens collected. Colonoscopy was recommended, but he left AMA.  Please arrange appt with Dr. Laural Golden for follow-up this week if at all possible. Copying him on this as well.

## 2018-04-16 ENCOUNTER — Inpatient Hospital Stay (HOSPITAL_COMMUNITY): Payer: Medicare Other

## 2018-04-16 ENCOUNTER — Inpatient Hospital Stay (HOSPITAL_COMMUNITY)
Admit: 2018-04-16 | Discharge: 2018-04-16 | Disposition: A | Discharge status: Home / Self Care | Payer: Medicare Other | Attending: Internal Medicine | Admitting: Internal Medicine

## 2018-04-16 DIAGNOSIS — F10239 Alcohol dependence with withdrawal, unspecified: Secondary | ICD-10-CM

## 2018-04-16 DIAGNOSIS — R569 Unspecified convulsions: Secondary | ICD-10-CM

## 2018-04-16 DIAGNOSIS — F1010 Alcohol abuse, uncomplicated: Secondary | ICD-10-CM

## 2018-04-16 DIAGNOSIS — D62 Acute posthemorrhagic anemia: Secondary | ICD-10-CM

## 2018-04-16 DIAGNOSIS — F10939 Alcohol use, unspecified with withdrawal, unspecified: Secondary | ICD-10-CM

## 2018-04-16 LAB — CBC
HEMATOCRIT: 21 % — AB (ref 39.0–52.0)
HEMOGLOBIN: 7 g/dL — AB (ref 13.0–17.0)
MCH: 35.4 pg — ABNORMAL HIGH (ref 26.0–34.0)
MCHC: 33.3 g/dL (ref 30.0–36.0)
MCV: 106.1 fL — ABNORMAL HIGH (ref 80.0–100.0)
NRBC: 0 % (ref 0.0–0.2)
Platelets: 117 10*3/uL — ABNORMAL LOW (ref 150–400)
RBC: 1.98 MIL/uL — AB (ref 4.22–5.81)
RDW: 14.7 % (ref 11.5–15.5)
WBC: 7.5 10*3/uL (ref 4.0–10.5)

## 2018-04-16 LAB — COMPREHENSIVE METABOLIC PANEL
ALT: 34 U/L (ref 0–44)
ANION GAP: 6 (ref 5–15)
AST: 105 U/L — ABNORMAL HIGH (ref 15–41)
Albumin: 2.3 g/dL — ABNORMAL LOW (ref 3.5–5.0)
Alkaline Phosphatase: 84 U/L (ref 38–126)
BILIRUBIN TOTAL: 3.3 mg/dL — AB (ref 0.3–1.2)
BUN: 10 mg/dL (ref 6–20)
CHLORIDE: 106 mmol/L (ref 98–111)
CO2: 22 mmol/L (ref 22–32)
Calcium: 7.9 mg/dL — ABNORMAL LOW (ref 8.9–10.3)
Creatinine, Ser: 0.65 mg/dL (ref 0.61–1.24)
GFR calc Af Amer: >60 mL/min (ref >60)
Glucose, Bld: 115 mg/dL — ABNORMAL HIGH (ref 70–99)
POTASSIUM: 3.3 mmol/L — AB (ref 3.5–5.1)
Sodium: 134 mmol/L — ABNORMAL LOW (ref 135–145)
TOTAL PROTEIN: 5.2 g/dL — AB (ref 6.5–8.1)

## 2018-04-16 LAB — RAPID URINE DRUG SCREEN, HOSP PERFORMED
AMPHETAMINES: NOT DETECTED
Barbiturates: NOT DETECTED
Benzodiazepines: NOT DETECTED
COCAINE: NOT DETECTED
OPIATES: NOT DETECTED
TETRAHYDROCANNABINOL: NOT DETECTED

## 2018-04-16 LAB — MAGNESIUM: MAGNESIUM: 1.6 mg/dL — AB (ref 1.7–2.4)

## 2018-04-16 LAB — URINALYSIS, COMPLETE (UACMP) WITH MICROSCOPIC
BILIRUBIN URINE: NEGATIVE
Bacteria, UA: NONE SEEN
Glucose, UA: NEGATIVE mg/dL
HGB URINE DIPSTICK: NEGATIVE
Ketones, ur: NEGATIVE mg/dL
Leukocytes, UA: NEGATIVE
Nitrite: NEGATIVE
PH: 7 (ref 5.0–8.0)
Protein, ur: NEGATIVE mg/dL
SPECIFIC GRAVITY, URINE: 1.002 — AB (ref 1.005–1.030)

## 2018-04-16 LAB — PREPARE RBC (CROSSMATCH)

## 2018-04-16 MED ORDER — LORAZEPAM 2 MG/ML IJ SOLN: 2.0000 mg | Freq: Once | INTRAMUSCULAR | Status: DC

## 2018-04-16 MED ORDER — POTASSIUM CHLORIDE CRYS ER 20 MEQ PO TBCR
20.0000 meq | EXTENDED_RELEASE_TABLET | Freq: Once | ORAL | Status: AC
  Administered 2018-04-16: 20 meq via ORAL
  Filled 2018-04-16: qty 1

## 2018-04-16 MED ORDER — LORAZEPAM 1 MG PO TABS: 2.0000 mg | ORAL_TABLET | ORAL | Status: DC | PRN

## 2018-04-16 MED ORDER — MAGNESIUM SULFATE 2 GM/50ML IV SOLN
2.0000 g | Freq: Once | INTRAVENOUS | Status: AC
  Administered 2018-04-16: 2 g via INTRAVENOUS
  Filled 2018-04-16: qty 50

## 2018-04-16 MED ORDER — SODIUM CHLORIDE 0.9% IV SOLUTION: Freq: Once | INTRAVENOUS | Status: DC

## 2018-04-16 MED ORDER — HALOPERIDOL LACTATE 5 MG/ML IJ SOLN
5.0000 mg | Freq: Once | INTRAMUSCULAR | Status: AC
  Administered 2018-04-17: 5 mg via INTRAVENOUS
  Filled 2018-04-16: qty 1

## 2018-04-16 MED ORDER — LORAZEPAM 2 MG/ML IJ SOLN
2.0000 mg | INTRAMUSCULAR | Status: DC | PRN
  Administered 2018-04-16: 2 mg via INTRAVENOUS
  Filled 2018-04-16: qty 1

## 2018-04-16 MED ORDER — HALOPERIDOL LACTATE 5 MG/ML IJ SOLN
5.0000 mg | Freq: Four times a day (QID) | INTRAMUSCULAR | Status: DC | PRN
  Administered 2018-04-16: 5 mg via INTRAVENOUS
  Filled 2018-04-16: qty 1

## 2018-04-16 NOTE — Progress Notes (Signed)
EEG completed; results pending.    

## 2018-04-16 NOTE — Progress Notes (Signed)
Patient refusing bed alarm and removing telemetry.  States he is, "not wearing it anymore".

## 2018-04-16 NOTE — Procedures (Signed)
History: 51 year old male being evaluated for seizure, description is initially with right-sided activity.  Sedation: None  Technique: This is a 21 channel routine scalp EEG performed at the bedside with bipolar and monopolar montages arranged in accordance to the international 10/20 system of electrode placement. One channel was dedicated to EKG recording.    Background: The background consists of intermixed alpha and beta activities. There is a well defined posterior dominant rhythm of 9-10 Hz that attenuates with eye opening. Sleep is recorded with normal appearing structures.  There are occasional sharp waves maximal at T7, P7 > O1.   Photic stimulation: Physiologic driving is not performed  EEG Abnormalities: 1) left posterior quadrant sharp waves  Clinical Interpretation: This EEG is consistent with a focal area of potential epileptogenicity(potential seizure focus) in the left posterior quadrant. There was no seizure recorded on this study.   Roland Rack, MD [facility] [phone]  If 7pm- 7am, please page neurology on call as listed in [place].

## 2018-04-16 NOTE — Progress Notes (Signed)
Patient is 52 year old Caucasian male who has a history of alcoholic cirrhosis as well as GI bleed in the past who was recently hospitalized and seen by Dr. Gala Romney during my absence.  He underwent a EGD and found to have grade 2 varices which were felt not to be the source of his bleeding.  He recommended colonoscopy but patient signed AMA. Patient now returns with seizure activity felt to be due to alcohol withdrawal.  According to patient's ex-wife who is at bedside he passed tarry stool yesterday but on his last admission he was passing burgundy blood.  He has not experienced hematemesis Patient's hemoglobin was 7 g this morning.  He has received 1 unit of PRBCs.  Patient's hemoglobin was 14.9 g when I saw him in the office on December 18, 2017. Patient is alert.  He knows he is in the hospital but he is disoriented to time. He does not have asterixis. He appears pale. Cardiac exam with regular rhythm normal S1 and S2.  No murmur or gallop noted. Lungs are clear to auscultation. Abdomen is full with long midline scar.  Abdomen is soft and nontender.  Liver spleen not palpable. Trace edema noted above his ankles.  Assessment:  Anemia secondary to GI bleed determined not to be from upper GI tract.  He was last evaluated in November 2018 for GI bleed which is felt to be either due to small bowel varices or diverticula.  It appears bleeding has slowed down.  If there is evidence of recurrent bleed would consider starting him on octreotide infusion.  He had colonoscopy in November 2018 I do not feel it needs to be repeated.  If there is evidence of recurrent bleed would consider small bowel study. It appears to be having alcohol withdrawal symptoms and not ready for any GI work-up at this time. Therefore will advance diet. She will be evaluated in a.m.
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
## 2018-04-16 NOTE — Progress Notes (Addendum)
PROGRESS NOTE  Arthur Johnson WSF:[id number] DOB: [birth year] DOA: 04/15/2018 PCP: [name], MD  Brief History:   52 y.o. male with medical history of alcohol abuse, liver cirrhosis, severe protein calorie malnutrition, diverticulosis, hypertension, COPD presenting after seizure-like activity around 9 AM on the morning of 04/15/2018.  The patient was sitting in his recliner eating breakfast around 930 to 10 AM.  His ex-wife noted the patient stiffened up his right arm to his face and subsequently developed tonic-clonic type activity.  She noticed that his eyes "rolled back into his head".  Apparently during his tonic type episode, the patient grabbed a hold of his arm and scratched himself drawing blood.  She stated the episode lasted approximately 5 minutes.  The patient had some postictal type symptoms upon EMS arrival.  By the time he arrived in the emergency department, the patient was lucid and communicative.  The patient states that he last drank alcohol on the evening of 04/11/2018.  Normally, the patient drinks approximately 8 beers on a daily basis.  He has drank on a daily basis for over 20 years.  He continues to smoke 1 pack/day.  He denies any other illegal drug use.  The patient was recently admitted to the hospital from 04/13/2018 through 04/14/2018.  During that hospitalization, the patient had rectal bleeding.  He underwent an EGD on 04/14/2018 which showed grade 2 nonbleeding esophageal varices and moderate portal hypertensive gastropathy.  The patient was planned to have a colonoscopy on 04/15/2018.  However, the patient left AGAINST MEDICAL ADVICE on the afternoon of 04/14/2018.  The patient denies any aura prior to his seizure-like activity.  He denied any chest pain, shortness breath, nausea, vomiting, diarrhea, abdominal pain, dysuria, hematuria.  He denies any illegal drug use.  He denies any new medications. In the emergency department, the patient had a low-grade  temperature of 99.2 F.  He was hemodynamically stable saturating 98% on room air.  BMP showed a potassium of 3.4 with sodium of 128.  Hemoglobin was 7.9.  Assessment/Plan: Alcohol withdrawal seizure -EEG--sharps in left posterior  -Since this is the patient's first seizure episode, obtain MRI of the brain-->neg -reviewed EEG and case discussed with epileptology--Dr.Karen Aquino-->would not start anti-epileptics at this time -Check CPK--278 -Alcohol withdrawal protocol -Although the patient did have some mildly depressed sodium, the patient has had history of low sodium when his medical record was reviewed. -no further seizures during the admission  Alcohol abuse with delirium/withdrawal  -alcohol withdrawal protocol  Acute blood loss anemia -appears that his baseline hemoglobin is around 11 -Hemoglobin 9.1 on the day of discharge 04/14/2018 -Presenting hemoglobin 7.9 -transfuse 2 units PRBC -GI consult -Continue Protonix -Clear liquid diet -04/14/2018 EGD grade 2 nonbleeding esophageal varices, moderate portal hypertensive gastropathy  Alcoholic liver cirrhosis -Continue home doses of nadolol, spironolactone -hep B,C neg -check alpha-fetoprotein  COPD -Stable on room air -Continue bronchodilators  Hypokalemia/Hypomagnesemia -replete -mag 1.6  Hyponatremia -continue IVF  Tobacco abuse -nicoderm patch -Total time spent 35 minutes.  Greater than 50% spent face to face counseling and coordinating care.    Disposition Plan:   Home in 1-2 days  Family Communication:  No  Family at bedside  Consultants:  GI  Code Status:  FULL   DVT Prophylaxis:  SCDs   Procedures: As Listed in Progress Note Above  Antibiotics: None    Subjective: I have discussed tobacco cessation with the patient.  I have counseled  the patient regarding the negative impacts of continued tobacco use including but not limited to lung cancer, COPD, and cardiovascular disease.  I have  discussed alternatives to tobacco and modalities that may help facilitate tobacco cessation including but not limited to biofeedback, hypnosis, and medications.  Total time spent with tobacco counseling was 4 minutes.   Objective: Vitals:   04/15/18 2149 04/16/18 0341 04/16/18 0641 04/16/18 0735  BP: (!) 81/52 (!) 87/45 (!) 85/57   Pulse: 82 71 83   Resp: 18  18   Temp: 98.5 F (36.9 C)  98.7 F (37.1 C)   TempSrc: Oral  Oral   SpO2: 97%  96% 97%  Weight:      Height:        Intake/Output Summary (Last 24 hours) at 04/16/2018 0853 Last data filed at 04/16/2018 0300 Gross per 24 hour  Intake 500.03 ml  Output 600 ml  Net -99.97 ml   Weight change:  Exam:   General:  Pt is alert, follows commands appropriately, not in acute distress  HEENT: No icterus, No thrush, No neck mass, [place]/AT  Cardiovascular: RRR, S1/S2, no rubs, no gallops  Respiratory: bibasilar crackles, no wheeze  Abdomen: Soft/+BS, non tender, non distended, no guarding  Extremities: No edema, No lymphangitis, No petechiae, No rashes, no synovitis   Data Reviewed: I have personally reviewed following labs and imaging studies Basic Metabolic Panel: Recent Labs  Lab 04/13/18 1641 04/14/18 0623 04/15/18 1059 04/16/18 0456  NA 127* 130* 128* 134*  K 4.8 4.1 3.4* 3.3*  CL 93* 100 98 106  CO2 23 23 20* 22  GLUCOSE 154* 159* 160* 115*  BUN 8 14 15 10   CREATININE 0.50* 0.69 0.74 0.65  CALCIUM 7.9* 7.6* 8.0* 7.9*  MG  --   --   --  1.6*   Liver Function Tests: Recent Labs  Lab 04/13/18 1641 04/14/18 0623 04/16/18 0456  AST 105* 82* 105*  ALT 37 33 34  ALKPHOS 135* 98 84  BILITOT 3.1* 3.9* 3.3*  PROT 6.6 5.6* 5.2*  ALBUMIN 2.8* 2.5* 2.3*   No results for input(s): LIPASE, AMYLASE in the last 168 hours. No results for input(s): AMMONIA in the last 168 hours. Coagulation Profile: Recent Labs  Lab 04/13/18 1641 04/14/18 0623  INR 1.24 1.41   CBC: Recent Labs  Lab 04/13/18 1641  04/13/18 2304 04/14/18 0623 04/15/18 1059 04/16/18 0456  WBC 10.7* 10.3 11.9* 8.8 7.5  HGB 11.8* 9.4* 9.1* 7.9* 7.0*  HCT 34.8* 27.4* 26.5* 23.2* 21.0*  MCV 102.1* 101.1* 104.7* 103.6* 106.1*  PLT 170 137* 149* 122* 117*   Cardiac Enzymes: Recent Labs  Lab 04/13/18 1900 04/13/18 2304 04/14/18 0623 04/15/18 1423  CKTOTAL  --   --   --  278  TROPONINI <0.03 <0.03 <0.03  --    BNP: Invalid input(s): POCBNP CBG: Recent Labs  Lab 04/15/18 1055  GLUCAP 166*   HbA1C: Recent Labs    04/14/18 0623 04/15/18 1501  HGBA1C 5.1 4.8   Urine analysis:    Component Value Date/Time   COLORURINE YELLOW 04/15/2018 [place] 04/15/2018 1434   LABSPEC 1.002 (L) 04/15/2018 1434   PHURINE 7.0 04/15/2018 1434   GLUCOSEU NEGATIVE 04/15/2018 1434   [place] 04/15/2018 [place] 04/15/2018 1434   [place] 04/15/2018 1434   PROTEINUR NEGATIVE 04/15/2018 1434   UROBILINOGEN 0.2 04/22/2013 1157   NITRITE NEGATIVE 04/15/2018 [place] 04/15/2018 1434  Sepsis Labs: @LABRCNTIP (procalcitonin:4,lacticidven:4) )No results found for this or any previous visit (from the past 240 hour(s)).   Scheduled Meds: . sodium chloride   Intravenous Once  . chlordiazePOXIDE  25 mg Oral Once  . folic acid  1 mg Oral Daily  . LORazepam  2 mg Intravenous Once  . multivitamin with minerals  1 tablet Oral Daily  . nadolol  20 mg Oral Daily  . nicotine  21 mg Transdermal Daily  . pantoprazole  40 mg Oral Daily  . potassium chloride  20 mEq Oral Once  . potassium chloride  40 mEq Oral Once  . spironolactone  50 mg Oral Daily  . thiamine  100 mg Oral Daily   Or  . thiamine  100 mg Intravenous Daily  . umeclidinium-vilanterol  1 puff Inhalation Daily   Continuous Infusions: . 0.9 % NaCl with KCl 20 mEq / L 75 mL/hr at 04/16/18 0000  . magnesium sulfate 1 - 4 g bolus IVPB      Procedures/Studies: Mr Brain Wo Contrast  Result  Date: 04/15/2018 CLINICAL DATA:  Staring episode. History of cirrhosis and hypertension. EXAM: MRI HEAD WITHOUT CONTRAST TECHNIQUE: Multiplanar, multiecho pulse sequences of the brain and surrounding structures were obtained without intravenous contrast. COMPARISON:  CT HEAD April 14, 2013 FINDINGS: INTRACRANIAL CONTENTS: No reduced diffusion to suggest acute ischemia. No susceptibility artifact to suggest hemorrhage. Mild global parenchymal brain volume loss. No hydrocephalus. No suspicious parenchymal signal, masses, mass effect. No abnormal extra-axial fluid collections. No extra-axial masses. Normal symmetric hippocampal size, morphology and signal. VASCULAR: Normal major intracranial vascular flow voids present at skull base. SKULL AND UPPER CERVICAL SPINE: No abnormal sellar expansion. No suspicious calvarial bone marrow signal. Craniocervical junction maintained. SINUSES/ORBITS: Mild paranasal sinus mucosal thickening. Mastoid air cells are well aerated.The included ocular globes and orbital contents are non-suspicious. OTHER: None. IMPRESSION: 1. No acute intracranial process. 2. Mild parenchymal brain volume loss, advanced for age. Electronically Signed   By: Elon Alas M.D.   On: 04/15/2018 16:01    Orson Eva, DO  [facility] Pager [phone]  If 7PM-7AM, please contact night-coverage [email or web address] Password [email or web address] 04/16/2018, 8:53 AM   LOS: 1 day

## 2018-04-16 NOTE — Progress Notes (Signed)
Patient pulling at restraints. Legs thrown over side of bed. Belligerent with staff. Order for Haldol will administer

## 2018-04-16 NOTE — Progress Notes (Signed)
Patient refusing bed alarm.  Patient educated on being a high fall risk and need for bed alarm.  Patient states, "I will sleep on the floor tonight cause it won't have an alarm."  Patient sitting on edge of bed - alarm on.

## 2018-04-16 NOTE — Progress Notes (Signed)
Patient is agitated, up roaming halls and peering into other patients rooms. States "I"m going to my truck". Redirect to room with sitter in place. CIWA score is 10, ativan given per protocol

## 2018-04-16 NOTE — Telephone Encounter (Signed)
Patient back in the hospital because of seizure disorder. Will see him later today.

## 2018-04-16 NOTE — Progress Notes (Signed)
Patient sitting on edge of bed and looking out window.  Patient states, "Look at that squirrel on the cedar tree."  Patient reports seeing a cedar tree out the window and the squirrel is climbing the tree.  No tree is able to be visualized out the window.  Only the side of the buildings are visible.

## 2018-04-16 NOTE — Progress Notes (Signed)
Pt ex wife stated that he was becoming agitated.  Pt was attempting to get out of bed. Prn meds given will continue to monitor.

## 2018-04-17 ENCOUNTER — Encounter (HOSPITAL_COMMUNITY): Payer: Self-pay | Admitting: Internal Medicine

## 2018-04-17 DIAGNOSIS — R569 Unspecified convulsions: Secondary | ICD-10-CM

## 2018-04-17 DIAGNOSIS — D62 Acute posthemorrhagic anemia: Secondary | ICD-10-CM

## 2018-04-17 DIAGNOSIS — F10239 Alcohol dependence with withdrawal, unspecified: Secondary | ICD-10-CM

## 2018-04-17 LAB — AMMONIA: AMMONIA: 70 umol/L — AB (ref 9–35)

## 2018-04-17 LAB — URINE CULTURE: CULTURE: NO GROWTH

## 2018-04-17 LAB — CBC
HCT: 28 % — ABNORMAL LOW (ref 39.0–52.0)
Hemoglobin: 9.3 g/dL — ABNORMAL LOW (ref 13.0–17.0)
MCH: 32.6 pg (ref 26.0–34.0)
MCHC: 33.2 g/dL (ref 30.0–36.0)
MCV: 98.2 fL (ref 80.0–100.0)
PLATELETS: 130 10*3/uL — AB (ref 150–400)
RBC: 2.85 MIL/uL — AB (ref 4.22–5.81)
RDW: 17.2 % — ABNORMAL HIGH (ref 11.5–15.5)
WBC: 9 10*3/uL (ref 4.0–10.5)
nRBC: 0 % (ref 0.0–0.2)

## 2018-04-17 LAB — COMPREHENSIVE METABOLIC PANEL
ALT: 42 U/L (ref 0–44)
ANION GAP: 9 (ref 5–15)
AST: 109 U/L — ABNORMAL HIGH (ref 15–41)
Albumin: 2.5 g/dL — ABNORMAL LOW (ref 3.5–5.0)
Alkaline Phosphatase: 93 U/L (ref 38–126)
BUN: 8 mg/dL (ref 6–20)
CALCIUM: 8.1 mg/dL — AB (ref 8.9–10.3)
CHLORIDE: 104 mmol/L (ref 98–111)
CO2: 19 mmol/L — AB (ref 22–32)
Creatinine, Ser: 0.72 mg/dL (ref 0.61–1.24)
GFR calc non Af Amer: >60 mL/min (ref >60)
GLUCOSE: 139 mg/dL — AB (ref 70–99)
Potassium: 3.7 mmol/L (ref 3.5–5.1)
SODIUM: 132 mmol/L — AB (ref 135–145)
TOTAL PROTEIN: 5.4 g/dL — AB (ref 6.5–8.1)
Total Bilirubin: 3.3 mg/dL — ABNORMAL HIGH (ref 0.3–1.2)

## 2018-04-17 LAB — BPAM RBC
Blood Product Expiration Date: 201911252359
Blood Product Expiration Date: 201911252359
ISSUE DATE / TIME: 201910292059
ISSUE DATE / TIME: 201910291401
UNIT TYPE AND RH: 6200
Unit Type and Rh: 6200

## 2018-04-17 LAB — TYPE AND SCREEN
ABO/RH(D): A POS
Antibody Screen: NEGATIVE
UNIT DIVISION: 0
Unit division: 0

## 2018-04-17 MED ORDER — DIPHENHYDRAMINE HCL 50 MG/ML IJ SOLN
25.0000 mg | Freq: Once | INTRAMUSCULAR | Status: AC
  Administered 2018-04-17: 25 mg via INTRAVENOUS
  Filled 2018-04-17: qty 1

## 2018-04-17 MED ORDER — LACTULOSE 10 GM/15ML PO SOLN
30.0000 g | Freq: Two times a day (BID) | ORAL | Status: DC
  Administered 2018-04-17: 30 g via ORAL
  Filled 2018-04-17: qty 60

## 2018-04-17 MED ORDER — CHLORDIAZEPOXIDE HCL 25 MG PO CAPS
25.0000 mg | ORAL_CAPSULE | Freq: Once | ORAL | Status: DC
  Filled 2018-04-17: qty 1

## 2018-04-17 MED ORDER — HALOPERIDOL LACTATE 5 MG/ML IJ SOLN
2.0000 mg | Freq: Once | INTRAMUSCULAR | Status: AC
  Administered 2018-04-17: 2 mg via INTRAVENOUS
  Filled 2018-04-17: qty 1

## 2018-04-17 MED ORDER — LACTULOSE 10 GM/15ML PO SOLN
30.0000 g | Freq: Three times a day (TID) | ORAL | Status: DC
  Administered 2018-04-17 – 2018-04-18 (×3): 30 g via ORAL
  Filled 2018-04-17 (×3): qty 60

## 2018-04-17 NOTE — Care Management Important Message (Signed)
Important Message  Patient Details  Name: Arthur Johnson MRN: [id number] Date of Birth: Apr 07, 1966   Medicare Important Message Given:  Yes    Shelda Altes 04/17/2018, 12:30 PM

## 2018-04-17 NOTE — Progress Notes (Signed)
Skin assessment, where patient pulling against restraints and skin rubbing against mitts, skin breakdown and bruising noted to bilateral wrist. Bruising also noted. Restraints removed.

## 2018-04-17 NOTE — Progress Notes (Signed)
Patient continues to be verbally abusive and resistant to care. Patient broke soft limb restraint, new one applied. Where patient is pulling against restraint, noted bruising to area around wrist, protective foam placed.

## 2018-04-17 NOTE — Progress Notes (Addendum)
Patient is asleep. Per sitter in room, has been up all night. Agitated. Had confusion and tried to leave. Per sitter, had a large black stool during the night. From notes, he received Haldol during the early am. Blood pressure 132/65, pulse (!) 122, temperature 98.2 F (36.8 C), resp. rate 20, height 5\' 8"  (1.727 m), weight 76.9 kg, SpO2 98 %. CBC    Component Value Date/Time   WBC 9.0 04/17/2018 0511   RBC 2.85 (L) 04/17/2018 0511   HGB 9.3 (L) 04/17/2018 0511   HCT 28.0 (L) 04/17/2018 0511   HCT 29.6 (L) 05/11/2017 0958   PLT 130 (L) 04/17/2018 0511   MCV 98.2 04/17/2018 0511   MCH 32.6 04/17/2018 0511   MCHC 33.2 04/17/2018 0511   RDW 17.2 (H) 04/17/2018 0511   LYMPHSABS 2.6 05/08/2017 1514   MONOABS 1.4 (H) 05/08/2017 1514   EOSABS 0.0 05/08/2017 1514   BASOSABS 0.1 05/08/2017 1514   GI bleed/melena.  Alcohol withdrawal. Will continue to monitor. Agitated last night. Received Haldol during early am.   GI attending note; Patient's condition discussed with his daughter and ex-wife earlier today in detail. Patient has decompensated alcoholic liver disease.  Alcoholic hepatitis should improve with some recovery and hepatic function provided he is able to abstain from drinking alcohol. Mental status changes appear to be due to hepatic encephalopathy and alcohol withdrawal. Source of GI bleeding not pinpointed but suspect small bowel diverticular bleed or small bowel variceal bleed. Appears he has stopped bleeding.  Therefore will hold off further work-up at this time.

## 2018-04-17 NOTE — Progress Notes (Signed)
Patient continues to be agitated and combative pulling against restraints. Patient at the foot of bed pulling against restraints sitter at bedside for safety. Patient attempting to bite at the restraints. Notified Dr. Maudie Mercury orders for haldol given.

## 2018-04-17 NOTE — Progress Notes (Signed)
Patient Demographics:    Arthur Johnson, is a 52 y.o. male, DOB - 17-Mar-1966, NOT:[id number]  Admit date - 04/15/2018   Admitting Physician Orson Eva, MD  Outpatient Primary MD for the patient is [name], MD  LOS - 2   Chief Complaint  Patient presents with  . Seizures        Subjective:    Arthur Johnson today has no fevers, no emesis,  No chest pain, agitated overnight required Haldol, had black BM last night but H&H stable,  Assessment  & Plan :    Active Problems:   Tobacco abuse   ETOH abuse   Hyponatremia   Acute blood loss anemia   Alcohol withdrawal seizure with complication, with unspecified complication (HCC)   Alcohol withdrawal syndrome with complication, with unspecified complication ([place])  Brief History:  52 y.o.malewith medical history ofalcohol abuse, liver cirrhosis, severe protein calorie malnutrition, diverticulosis, hypertension, COPD presenting after seizure-like activity around 9 AM on the morning of 04/15/2018. The patient was sitting in his recliner eating breakfast around 930 to 10 AM. His ex-wife noted the patient stiffened up his right arm to his face and subsequently developed tonic-clonic type activity. She noticed that his eyes "rolled back into his head". Apparently during his tonic type episode, the patient grabbed a hold of his arm and scratched himself drawing blood. She stated the episode lasted approximately 5 minutes. The patient had some postictal type symptoms upon EMS arrival. By the time he arrived in the emergency department, the patient was lucid and communicative. The patient states that he last drank alcohol on the evening of 04/11/2018. Normally, the patient drinks approximately 8 beers on a daily basis. He has drank on a daily basis for over 20 years. He continues to smoke 1 pack/day. He denies any other illegal drug use. The patient  was recently admitted to the hospital from 04/13/2018 through 04/14/2018. During that hospitalization, the patient had rectal bleeding. He underwent an EGD on 04/14/2018 which showed grade 2 nonbleeding esophageal varices and moderate portal hypertensive gastropathy. The patient was planned to have a colonoscopy on 04/15/2018. However, the patient left AGAINST MEDICAL ADVICE on the afternoon of 04/14/2018. The patient denies any aura prior to his seizure-like activity. He denied any chest pain, shortness breath, nausea, vomiting, diarrhea, abdominal pain, dysuria, hematuria. He denies any illegal drug use. He denies any new medications. In the emergency department, the patient had a low-grade temperature of 99.2 F. He was hemodynamically stable saturating 98% on room air. BMP showed a potassium of 3.4 with sodium of 128. Hemoglobin was 7.9.  Assessment/Plan: Alcohol withdrawal seizure -EEG--sharps in left posterior  -Since this is the patient's first seizure episode, obtain MRI of the brain-->neg -reviewed EEG and case discussed with epileptology--Dr.Karen Aquino-->would not start anti-epileptics at this time - CPK--278 -Continue lorazepam CIWA alcohol withdrawal protocol -no further seizures during the admission  Alcohol abuse with delirium/withdrawal  -Lorazepam CIWA alcohol withdrawal protocol, folic acid and thiamine  Acute blood loss anemia -appears that his baseline hemoglobin is around 11 -Hemoglobin 9.1 on the day of discharge 04/14/2018 -Presenting hemoglobin 7.9, hemoglobin is back to 9.0 after transfusion of 2 units of packed cells on 04/16/2018 -Continue Protonix -04/14/2018 EGD grade 2 nonbleeding esophageal  varices, moderate portal hypertensive gastropathy, GI input appreciated  Alcoholic liver cirrhosis -Continue home doses of nadolol, spironolactone -hep B,C neg - alpha-fetoprotein pending, Ammonia is 70 with lethargy and confusion, treat empirically with  lactulose to achieve 3 BMs at least each day  COPD -No acute exacerbation at this time, stable on room air -Continue bronchodilators  Hypokalemia/Hypomagnesemia -replete  Hyponatremia -continue IVF  Tobacco abuse -nicoderm patch    Disposition Plan:   Home in 1-2 days  Family Communication:  No  Family at bedside  Consultants:  GI/phone consult with neurology  Code Status:  FULL   DVT Prophylaxis:  SCDs   Disposition/Need for in-Hospital Stay- patient unable to be discharged at this time due to concerns about ongoing GI blood loss, possible discharge if H&H is stable over the next 24 hours and if mental status improves after treatment with lactulose for elevated ammonia in the setting of alcohol abuse and  alcoholic liver disease*    Lab Results  Component Value Date   PLT 130 (L) 04/17/2018    Inpatient Medications  Scheduled Meds: . sodium chloride   Intravenous Once  . chlordiazePOXIDE  25 mg Oral Once  . folic acid  1 mg Oral Daily  . lactulose  30 g Oral TID  . LORazepam  2 mg Intravenous Once  . multivitamin with minerals  1 tablet Oral Daily  . nadolol  20 mg Oral Daily  . nicotine  21 mg Transdermal Daily  . pantoprazole  40 mg Oral Daily  . potassium chloride  40 mEq Oral Once  . spironolactone  50 mg Oral Daily  . thiamine  100 mg Oral Daily   Or  . thiamine  100 mg Intravenous Daily  . umeclidinium-vilanterol  1 puff Inhalation Daily   Continuous Infusions: PRN Meds:.acetaminophen **OR** acetaminophen, haloperidol lactate, LORazepam **OR** LORazepam, ondansetron **OR** ondansetron (ZOFRAN) IV    Anti-infectives (From admission, onward)   None        Objective:   Vitals:   04/17/18 0851 04/17/18 0924 04/17/18 1200 04/17/18 1421  BP:   109/73 104/70  Pulse:   85 85  Resp:   19 19  Temp:   98.5 F (36.9 C) 98.3 F (36.8 C)  TempSrc:   Oral Oral  SpO2: 98% 98%  100%  Weight:      Height:        Wt Readings from Last 3  Encounters:  04/15/18 76.9 kg  04/14/18 78.1 kg  12/18/17 80.7 kg     Intake/Output Summary (Last 24 hours) at 04/17/2018 1643 Last data filed at 04/17/2018 1300 Gross per 24 hour  Intake 1335 ml  Output -  Net 1335 ml     Physical Exam Patient is examined daily including today on 04/17/18 , exams remain the same as of yesterday except that has changed   Gen:-Somewhat sleepy but wakes up enough, HEENT:- [place].AT, No sclera icterus Neck-Supple Neck,No JVD,.  Lungs-  CTAB , fair air movement CV- S1, S2 normal, regular Abd-  +ve B.Sounds, Abd Soft, No tenderness,    Extremity/Skin:- No  edema, good pulses Psych-affect is flat, pt is confused, disoriented,  neuro-generalized weakness without new focal deficits, no tremors   Data Review:   Micro Results Recent Results (from the past 240 hour(s))  Culture, Urine     Status: None   Collection Time: 04/15/18  2:34 PM  Result Value Ref Range Status   Specimen Description   Final    URINE,  RANDOM Performed at [facility], [address] [place], [place] [address]    Special Requests   Final    NONE Performed at [facility], [address] [place], [place] [address]    Culture   Final    NO GROWTH Performed at [facility], [place] [address], [place], [place] [address]    Report Status 04/17/2018 FINAL  Final    Radiology Reports Mr Brain Wo Contrast  Result Date: 04/15/2018 CLINICAL DATA:  Staring episode. History of cirrhosis and hypertension. EXAM: MRI HEAD WITHOUT CONTRAST TECHNIQUE: Multiplanar, multiecho pulse sequences of the brain and surrounding structures were obtained without intravenous contrast. COMPARISON:  CT HEAD April 14, 2013 FINDINGS: INTRACRANIAL CONTENTS: No reduced diffusion to suggest acute ischemia. No susceptibility artifact to suggest hemorrhage. Mild global parenchymal brain volume loss. No hydrocephalus. No suspicious parenchymal signal, masses, mass effect. No abnormal extra-axial fluid  collections. No extra-axial masses. Normal symmetric hippocampal size, morphology and signal. VASCULAR: Normal major intracranial vascular flow voids present at skull base. SKULL AND UPPER CERVICAL SPINE: No abnormal sellar expansion. No suspicious calvarial bone marrow signal. Craniocervical junction maintained. SINUSES/ORBITS: Mild paranasal sinus mucosal thickening. Mastoid air cells are well aerated.The included ocular globes and orbital contents are non-suspicious. OTHER: None. IMPRESSION: 1. No acute intracranial process. 2. Mild parenchymal brain volume loss, advanced for age. Electronically Signed   By: Elon Alas M.D.   On: 04/15/2018 16:01     CBC Recent Labs  Lab 04/13/18 2304 04/14/18 0623 04/15/18 1059 04/16/18 0456 04/17/18 0511  WBC 10.3 11.9* 8.8 7.5 9.0  HGB 9.4* 9.1* 7.9* 7.0* 9.3*  HCT 27.4* 26.5* 23.2* 21.0* 28.0*  PLT 137* 149* 122* 117* 130*  MCV 101.1* 104.7* 103.6* 106.1* 98.2  MCH 34.7* 36.0* 35.3* 35.4* 32.6  MCHC 34.3 34.3 34.1 33.3 33.2  RDW 14.9 14.9 14.8 14.7 17.2*    Chemistries  Recent Labs  Lab 04/13/18 1641 04/14/18 0623 04/15/18 1059 04/16/18 0456 04/17/18 0511  NA 127* 130* 128* 134* 132*  K 4.8 4.1 3.4* 3.3* 3.7  CL 93* 100 98 106 104  CO2 23 23 20* 22 19*  GLUCOSE 154* 159* 160* 115* 139*  BUN 8 14 15 10 8   CREATININE 0.50* 0.69 0.74 0.65 0.72  CALCIUM 7.9* 7.6* 8.0* 7.9* 8.1*  MG  --   --   --  1.6*  --   AST 105* 82*  --  105* 109*  ALT 37 33  --  34 42  ALKPHOS 135* 98  --  84 93  BILITOT 3.1* 3.9*  --  3.3* 3.3*   ------------------------------------------------------------------------------------------------------------------ No results for input(s): CHOL, HDL, LDLCALC, TRIG, CHOLHDL, LDLDIRECT in the last 72 hours.  Lab Results  Component Value Date   HGBA1C 4.8 04/15/2018   ------------------------------------------------------------------------------------------------------------------ No results for input(s):  TSH, T4TOTAL, T3FREE, THYROIDAB in the last 72 hours.  Invalid input(s): FREET3 ------------------------------------------------------------------------------------------------------------------ No results for input(s): VITAMINB12, FOLATE, FERRITIN, TIBC, IRON, RETICCTPCT in the last 72 hours.  Coagulation profile Recent Labs  Lab 04/13/18 1641 04/14/18 0623  INR 1.24 1.41    No results for input(s): DDIMER in the last 72 hours.  Cardiac Enzymes Recent Labs  Lab 04/13/18 1900 04/13/18 2304 04/14/18 0623  TROPONINI <0.03 <0.03 <0.03   ------------------------------------------------------------------------------------------------------------------ No results found for: BNP   Ledger Heindl M.D on 04/17/2018 at 4:43 PM  Pager---[phone] Go to [email or web address] - password [email or web address] for contact info  [facility] - Office  [phone]

## 2018-04-18 LAB — COMPREHENSIVE METABOLIC PANEL
ALBUMIN: 2.4 g/dL — AB (ref 3.5–5.0)
ALK PHOS: 93 U/L (ref 38–126)
ALT: 45 U/L — ABNORMAL HIGH (ref 0–44)
AST: 118 U/L — AB (ref 15–41)
Anion gap: 7 (ref 5–15)
BUN: 7 mg/dL (ref 6–20)
CALCIUM: 8 mg/dL — AB (ref 8.9–10.3)
CO2: 21 mmol/L — ABNORMAL LOW (ref 22–32)
Chloride: 106 mmol/L (ref 98–111)
Creatinine, Ser: 0.56 mg/dL — ABNORMAL LOW (ref 0.61–1.24)
GFR calc Af Amer: >60 mL/min (ref >60)
GLUCOSE: 152 mg/dL — AB (ref 70–99)
Potassium: 3.6 mmol/L (ref 3.5–5.1)
Sodium: 134 mmol/L — ABNORMAL LOW (ref 135–145)
Total Bilirubin: 2.7 mg/dL — ABNORMAL HIGH (ref 0.3–1.2)
Total Protein: 5.2 g/dL — ABNORMAL LOW (ref 6.5–8.1)

## 2018-04-18 LAB — CBC
HCT: 26.9 % — ABNORMAL LOW (ref 39.0–52.0)
Hemoglobin: 9.1 g/dL — ABNORMAL LOW (ref 13.0–17.0)
MCH: 34.2 pg — AB (ref 26.0–34.0)
MCHC: 33.8 g/dL (ref 30.0–36.0)
MCV: 101.1 fL — ABNORMAL HIGH (ref 80.0–100.0)
PLATELETS: 154 10*3/uL (ref 150–400)
RBC: 2.66 MIL/uL — ABNORMAL LOW (ref 4.22–5.81)
RDW: 18 % — AB (ref 11.5–15.5)
WBC: 8.6 10*3/uL (ref 4.0–10.5)
nRBC: 0 % (ref 0.0–0.2)

## 2018-04-18 LAB — AFP TUMOR MARKER: AFP, SERUM, TUMOR MARKER: 3.9 ng/mL (ref 0.0–8.3)

## 2018-04-18 MED ORDER — NICOTINE 21 MG/24HR TD PT24: 21.0000 mg | MEDICATED_PATCH | Freq: Every day | TRANSDERMAL | 2 refills | Status: DC

## 2018-04-18 MED ORDER — FOLIC ACID 1 MG PO TABS: 1.0000 mg | ORAL_TABLET | Freq: Every day | ORAL | 3 refills | Status: DC

## 2018-04-18 MED ORDER — ANORO ELLIPTA 62.5-25 MCG/INH IN AEPB: 1.0000 | INHALATION_SPRAY | Freq: Every day | RESPIRATORY_TRACT | 1 refills | Status: DC

## 2018-04-18 MED ORDER — FUROSEMIDE 20 MG PO TABS: 20.0000 mg | ORAL_TABLET | Freq: Every day | ORAL | 11 refills | Status: DC

## 2018-04-18 MED ORDER — SPIRONOLACTONE 50 MG PO TABS: 50.0000 mg | ORAL_TABLET | Freq: Every day | ORAL | 3 refills | Status: DC

## 2018-04-18 MED ORDER — PANTOPRAZOLE SODIUM 20 MG PO TBEC: 20.0000 mg | DELAYED_RELEASE_TABLET | Freq: Every day | ORAL | 2 refills | Status: DC

## 2018-04-18 MED ORDER — LACTULOSE 10 GM/15ML PO SOLN: 20.0000 g | Freq: Every day | ORAL | 3 refills | Status: DC

## 2018-04-18 MED ORDER — ONDANSETRON HCL 4 MG PO TABS: 4.0000 mg | ORAL_TABLET | Freq: Four times a day (QID) | ORAL | 0 refills | Status: DC | PRN

## 2018-04-18 MED ORDER — NADOLOL 20 MG PO TABS: 20.0000 mg | ORAL_TABLET | Freq: Every day | ORAL | 2 refills | Status: DC

## 2018-04-18 MED ORDER — ADULT MULTIVITAMIN W/MINERALS CH: 1.0000 | ORAL_TABLET | Freq: Every day | ORAL | 2 refills | Status: DC

## 2018-04-18 NOTE — Progress Notes (Signed)
Sitter order discontinued. Pt is alert and oriented. Pt has not been agitated or aggressive during shift. Will continue to round on patient.

## 2018-04-18 NOTE — Discharge Instructions (Signed)
1)Complete Abstinence from Alcohol advised 2)Take your medications as prescribed 3)You can use Nicotine patch to help you quit smoking 4)Follow-up with a Primary care doctor within a week for repeat CBC/complete blood count and CMP/liver and electrolyte test 5) follow-up with your gastroenterologist for reevaluation and possible repeat EGD/colonoscopy 6)Avoid ibuprofen/Advil/Aleve/Motrin/Goody Powders/Naproxen/BC powders/Meloxicam/Diclofenac/Indomethacin and other Nonsteroidal anti-inflammatory medications as these will make you more likely to bleed and can cause stomach ulcers, can also cause Kidney problems.
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
## 2018-04-18 NOTE — Progress Notes (Signed)
IV removed, patient tolerated well.  Reviewed AVS with patient and patient's exwife, both verbalized understanding.  Patient to be transported home by patient's exwife.

## 2018-04-18 NOTE — Progress Notes (Signed)
  Subjective:  Patient has no complaints.  He states he ate all of his breakfast.  He had a bowel movement it is brown and black but not like before.  He denies abdominal pain nausea or vomiting.  He has decided not to go for rehab.  He is being discharged.  Objective: Blood pressure 95/62, pulse 93, temperature 98.9 F (37.2 C), temperature source Oral, resp. rate 16, height 5\' 8"  (1.727 m), weight 76.9 kg, SpO2 95 %. Patient is alert and oriented to place person and time. He does not have asterixis or tremors. Conjunctiva is pink. Sclera is mildly icteric Oropharyngeal mucosa is normal. No neck masses or thyromegaly noted.. Abdomen is full.  There is long midline scar.  On palpation it is soft and nontender.  Liver edge is easily palpable below RCM.  It is firm and nontender.  Left lobe of the liver is also palpable. Trace edema around ankles.  Labs/studies Results:  Recent Labs    Apr 20, 2018 0456 04/17/18 0511 04/18/18 0535  WBC 7.5 9.0 8.6  HGB 7.0* 9.3* 9.1*  HCT 21.0* 28.0* 26.9*  PLT 117* 130* 154    BMET  Recent Labs    20-Apr-2018 0456 04/17/18 0511 04/18/18 0535  NA 134* 132* 134*  K 3.3* 3.7 3.6  CL 106 104 106  CO2 22 19* 21*  GLUCOSE 115* 139* 152*  BUN 10 8 7   CREATININE 0.65 0.72 0.56*  CALCIUM 7.9* 8.1* 8.0*    LFT  Recent Labs    04/20/2018 0456 04/17/18 0511 04/18/18 0535  PROT 5.2* 5.4* 5.2*  ALBUMIN 2.3* 2.5* 2.4*  AST 105* 109* 118*  ALT 34 42 45*  ALKPHOS 84 93 93  BILITOT 3.3* 3.3* 2.7*     Assessment:  #1.  Decompensated alcoholic liver disease.  He has alcoholic cirrhosis and on top of that he has alcoholic hepatitis and he appears to be improving.  He may recover some hepatic function with alcohol abstinence as acute injury subsides.  If he chooses to drink alcohol again his prognosis is very poor.  I does expect that he will be back soon.  #2.  GI bleed possibly from small bowel diverticula or varices.  He has stopped bleeding.   Hemoglobin is stable.  #3.  Mental status changes secondary to alcohol withdrawal and hepatic encephalopathy.  Significant improvement in the last 24 hours.  #4.  Alcohol withdrawal seizures.   Recommendations:  Patient's condition discussed with him and his ex-wife who is at bedside. If he is going to have any chance he must not drink alcohol anymore. Patient should refrain from using aspirin or other NSAIDs. We will plan to see him in the office in 2 weeks.  He will have blood work prior to that visit. He is to continue nadolol. Weight should be checked daily at home.  If he loses more than 5 pounds he should contact our office at which time diuretic therapy would be reevaluated.

## 2018-04-20 ENCOUNTER — Encounter (HOSPITAL_COMMUNITY): Payer: Self-pay

## 2018-04-20 ENCOUNTER — Emergency Department (HOSPITAL_COMMUNITY)
Admission: EM | Admit: 2018-04-20 | Discharge: 2018-04-20 | Disposition: A | Discharge status: Home / Self Care | Payer: Medicare Other | Attending: Emergency Medicine | Admitting: Emergency Medicine

## 2018-04-20 ENCOUNTER — Other Ambulatory Visit: Payer: Self-pay

## 2018-04-20 DIAGNOSIS — I10 Essential (primary) hypertension: Secondary | ICD-10-CM | POA: Insufficient documentation

## 2018-04-20 DIAGNOSIS — J449 Chronic obstructive pulmonary disease, unspecified: Secondary | ICD-10-CM | POA: Diagnosis not present

## 2018-04-20 DIAGNOSIS — K922 Gastrointestinal hemorrhage, unspecified: Secondary | ICD-10-CM | POA: Diagnosis present

## 2018-04-20 DIAGNOSIS — F17210 Nicotine dependence, cigarettes, uncomplicated: Secondary | ICD-10-CM | POA: Diagnosis not present

## 2018-04-20 DIAGNOSIS — K625 Hemorrhage of anus and rectum: Secondary | ICD-10-CM

## 2018-04-20 DIAGNOSIS — Z79899 Other long term (current) drug therapy: Secondary | ICD-10-CM | POA: Insufficient documentation

## 2018-04-20 DIAGNOSIS — F1010 Alcohol abuse, uncomplicated: Secondary | ICD-10-CM | POA: Diagnosis present

## 2018-04-20 LAB — CBC WITH DIFFERENTIAL/PLATELET
Abs Immature Granulocytes: 0.04 10*3/uL (ref 0.00–0.07)
Basophils Absolute: 0.1 10*3/uL (ref 0.0–0.1)
Basophils Relative: 1 %
EOS ABS: 0.2 10*3/uL (ref 0.0–0.5)
Eosinophils Relative: 2 %
HCT: 29.8 % — ABNORMAL LOW (ref 39.0–52.0)
Hemoglobin: 9.6 g/dL — ABNORMAL LOW (ref 13.0–17.0)
IMMATURE GRANULOCYTES: 0 %
Lymphocytes Relative: 28 %
Lymphs Abs: 2.8 10*3/uL (ref 0.7–4.0)
MCH: 33 pg (ref 26.0–34.0)
MCHC: 32.2 g/dL (ref 30.0–36.0)
MCV: 102.4 fL — AB (ref 80.0–100.0)
MONOS PCT: 17 %
Monocytes Absolute: 1.7 10*3/uL — ABNORMAL HIGH (ref 0.1–1.0)
NEUTROS PCT: 52 %
NRBC: 0 % (ref 0.0–0.2)
Neutro Abs: 5.2 10*3/uL (ref 1.7–7.7)
Platelets: 222 10*3/uL (ref 150–400)
RBC: 2.91 MIL/uL — ABNORMAL LOW (ref 4.22–5.81)
RDW: 17 % — AB (ref 11.5–15.5)
WBC: 10.1 10*3/uL (ref 4.0–10.5)

## 2018-04-20 LAB — I-STAT CHEM 8, ED
BUN: 5 mg/dL — ABNORMAL LOW (ref 6–20)
CREATININE: 0.6 mg/dL — AB (ref 0.61–1.24)
Calcium, Ion: 1.09 mmol/L — ABNORMAL LOW (ref 1.15–1.40)
Chloride: 98 mmol/L (ref 98–111)
Glucose, Bld: 167 mg/dL — ABNORMAL HIGH (ref 70–99)
HEMATOCRIT: 33 % — AB (ref 39.0–52.0)
HEMOGLOBIN: 11.2 g/dL — AB (ref 13.0–17.0)
Potassium: 3.5 mmol/L (ref 3.5–5.1)
SODIUM: 132 mmol/L — AB (ref 135–145)
TCO2: 21 mmol/L — AB (ref 22–32)

## 2018-04-20 LAB — COMPREHENSIVE METABOLIC PANEL
ALT: 54 U/L — ABNORMAL HIGH (ref 0–44)
ANION GAP: 7 (ref 5–15)
AST: 116 U/L — AB (ref 15–41)
Albumin: 2.8 g/dL — ABNORMAL LOW (ref 3.5–5.0)
Alkaline Phosphatase: 117 U/L (ref 38–126)
BUN: 8 mg/dL (ref 6–20)
CHLORIDE: 99 mmol/L (ref 98–111)
CO2: 21 mmol/L — ABNORMAL LOW (ref 22–32)
Calcium: 8 mg/dL — ABNORMAL LOW (ref 8.9–10.3)
Creatinine, Ser: 0.54 mg/dL — ABNORMAL LOW (ref 0.61–1.24)
GFR calc Af Amer: >60 mL/min (ref >60)
Glucose, Bld: 172 mg/dL — ABNORMAL HIGH (ref 70–99)
POTASSIUM: 3.4 mmol/L — AB (ref 3.5–5.1)
Sodium: 127 mmol/L — ABNORMAL LOW (ref 135–145)
TOTAL PROTEIN: 6.3 g/dL — AB (ref 6.5–8.1)
Total Bilirubin: 2.6 mg/dL — ABNORMAL HIGH (ref 0.3–1.2)

## 2018-04-20 LAB — TYPE AND SCREEN
ABO/RH(D): A POS
Antibody Screen: NEGATIVE

## 2018-04-20 LAB — CBC
HCT: 28.3 % — ABNORMAL LOW (ref 39.0–52.0)
HEMOGLOBIN: 9.1 g/dL — AB (ref 13.0–17.0)
MCH: 32.6 pg (ref 26.0–34.0)
MCHC: 32.2 g/dL (ref 30.0–36.0)
MCV: 101.4 fL — ABNORMAL HIGH (ref 80.0–100.0)
PLATELETS: 210 10*3/uL (ref 150–400)
RBC: 2.79 MIL/uL — AB (ref 4.22–5.81)
RDW: 16.8 % — AB (ref 11.5–15.5)
WBC: 8.8 10*3/uL (ref 4.0–10.5)
nRBC: 0 % (ref 0.0–0.2)

## 2018-04-20 LAB — PROTIME-INR
INR: 1.21
PROTHROMBIN TIME: 15.2 s (ref 11.4–15.2)

## 2018-04-20 MED ORDER — SODIUM CHLORIDE 0.9 % IV SOLN
8.0000 mg/h | INTRAVENOUS | Status: DC
  Filled 2018-04-20 (×6): qty 80

## 2018-04-20 MED ORDER — OCTREOTIDE LOAD VIA INFUSION
50.0000 ug | Freq: Once | INTRAVENOUS | Status: DC
  Filled 2018-04-20: qty 25

## 2018-04-20 MED ORDER — SODIUM CHLORIDE 0.9 % IV SOLN
50.0000 ug/h | INTRAVENOUS | Status: DC
  Filled 2018-04-20 (×6): qty 1

## 2018-04-20 MED ORDER — SODIUM CHLORIDE 0.9 % IV SOLN
80.0000 mg | Freq: Once | INTRAVENOUS | Status: AC
  Administered 2018-04-20: 80 mg via INTRAVENOUS
  Filled 2018-04-20: qty 80

## 2018-04-20 MED ORDER — POTASSIUM CHLORIDE CRYS ER 20 MEQ PO TBCR
40.0000 meq | EXTENDED_RELEASE_TABLET | Freq: Once | ORAL | Status: AC
  Administered 2018-04-20: 40 meq via ORAL
  Filled 2018-04-20: qty 2

## 2018-04-20 MED ORDER — SODIUM CHLORIDE 0.9 % IV SOLN
80.0000 mg | Freq: Once | INTRAVENOUS | Status: DC
  Filled 2018-04-20: qty 80

## 2018-04-20 MED ORDER — SODIUM CHLORIDE 0.9 % IV BOLUS: 500.0000 mL | Freq: Once | INTRAVENOUS | Status: DC

## 2018-04-20 NOTE — Discharge Instructions (Addendum)
Follow up with Dr. Laural Golden as planned

## 2018-04-20 NOTE — ED Triage Notes (Signed)
Pt reports that he had BM this morning and red blood in toilet. Pt reports that he was discharged on Thursday due to same problem
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
## 2018-04-21 ENCOUNTER — Encounter (HOSPITAL_COMMUNITY): Payer: Self-pay | Admitting: Emergency Medicine

## 2018-04-21 ENCOUNTER — Other Ambulatory Visit: Payer: Self-pay

## 2018-04-21 ENCOUNTER — Observation Stay (HOSPITAL_COMMUNITY)
Admission: EM | Admit: 2018-04-21 | Discharge: 2018-04-23 | Disposition: A | Discharge status: Home / Self Care | Payer: Medicare Other | Attending: Internal Medicine | Admitting: Internal Medicine

## 2018-04-21 ENCOUNTER — Observation Stay (HOSPITAL_COMMUNITY): Payer: Medicare Other

## 2018-04-21 DIAGNOSIS — Z79899 Other long term (current) drug therapy: Secondary | ICD-10-CM | POA: Insufficient documentation

## 2018-04-21 DIAGNOSIS — J449 Chronic obstructive pulmonary disease, unspecified: Secondary | ICD-10-CM | POA: Insufficient documentation

## 2018-04-21 DIAGNOSIS — F17210 Nicotine dependence, cigarettes, uncomplicated: Secondary | ICD-10-CM | POA: Diagnosis not present

## 2018-04-21 DIAGNOSIS — D62 Acute posthemorrhagic anemia: Secondary | ICD-10-CM | POA: Insufficient documentation

## 2018-04-21 DIAGNOSIS — K219 Gastro-esophageal reflux disease without esophagitis: Secondary | ICD-10-CM | POA: Insufficient documentation

## 2018-04-21 DIAGNOSIS — K3189 Other diseases of stomach and duodenum: Secondary | ICD-10-CM | POA: Insufficient documentation

## 2018-04-21 DIAGNOSIS — K766 Portal hypertension: Secondary | ICD-10-CM | POA: Insufficient documentation

## 2018-04-21 DIAGNOSIS — K922 Gastrointestinal hemorrhage, unspecified: Secondary | ICD-10-CM | POA: Diagnosis present

## 2018-04-21 DIAGNOSIS — F1010 Alcohol abuse, uncomplicated: Secondary | ICD-10-CM | POA: Insufficient documentation

## 2018-04-21 DIAGNOSIS — K625 Hemorrhage of anus and rectum: Secondary | ICD-10-CM | POA: Diagnosis not present

## 2018-04-21 DIAGNOSIS — F1019 Alcohol abuse with unspecified alcohol-induced disorder: Secondary | ICD-10-CM | POA: Diagnosis present

## 2018-04-21 DIAGNOSIS — I8510 Secondary esophageal varices without bleeding: Secondary | ICD-10-CM | POA: Insufficient documentation

## 2018-04-21 DIAGNOSIS — I10 Essential (primary) hypertension: Secondary | ICD-10-CM | POA: Insufficient documentation

## 2018-04-21 DIAGNOSIS — K7031 Alcoholic cirrhosis of liver with ascites: Secondary | ICD-10-CM | POA: Diagnosis not present

## 2018-04-21 DIAGNOSIS — E871 Hypo-osmolality and hyponatremia: Secondary | ICD-10-CM | POA: Diagnosis not present

## 2018-04-21 LAB — CBC
HEMATOCRIT: 27.5 % — AB (ref 39.0–52.0)
HEMATOCRIT: 26.6 % — AB (ref 39.0–52.0)
HEMOGLOBIN: 8.9 g/dL — AB (ref 13.0–17.0)
Hemoglobin: 8.9 g/dL — ABNORMAL LOW (ref 13.0–17.0)
MCH: 32.7 pg (ref 26.0–34.0)
MCH: 33.1 pg (ref 26.0–34.0)
MCHC: 32.4 g/dL (ref 30.0–36.0)
MCHC: 33.5 g/dL (ref 30.0–36.0)
MCV: 101.1 fL — ABNORMAL HIGH (ref 80.0–100.0)
MCV: 98.9 fL (ref 80.0–100.0)
NRBC: 0 % (ref 0.0–0.2)
PLATELETS: 236 10*3/uL (ref 150–400)
Platelets: 251 10*3/uL (ref 150–400)
RBC: 2.72 MIL/uL — ABNORMAL LOW (ref 4.22–5.81)
RBC: 2.69 MIL/uL — AB (ref 4.22–5.81)
RDW: 16.7 % — AB (ref 11.5–15.5)
RDW: 16.5 % — ABNORMAL HIGH (ref 11.5–15.5)
WBC: 9.4 10*3/uL (ref 4.0–10.5)
WBC: 11.1 10*3/uL — AB (ref 4.0–10.5)
nRBC: 0 % (ref 0.0–0.2)

## 2018-04-21 LAB — COMPREHENSIVE METABOLIC PANEL
ALK PHOS: 94 U/L (ref 38–126)
ALT: 50 U/L — ABNORMAL HIGH (ref 0–44)
AST: 97 U/L — AB (ref 15–41)
Albumin: 2.4 g/dL — ABNORMAL LOW (ref 3.5–5.0)
Anion gap: 6 (ref 5–15)
BILIRUBIN TOTAL: 2.8 mg/dL — AB (ref 0.3–1.2)
BUN: 6 mg/dL (ref 6–20)
CALCIUM: 7.8 mg/dL — AB (ref 8.9–10.3)
CO2: 22 mmol/L (ref 22–32)
CREATININE: 0.72 mg/dL (ref 0.61–1.24)
Chloride: 100 mmol/L (ref 98–111)
Glucose, Bld: 242 mg/dL — ABNORMAL HIGH (ref 70–99)
Potassium: 3.3 mmol/L — ABNORMAL LOW (ref 3.5–5.1)
Sodium: 128 mmol/L — ABNORMAL LOW (ref 135–145)
TOTAL PROTEIN: 5.4 g/dL — AB (ref 6.5–8.1)

## 2018-04-21 LAB — TYPE AND SCREEN
ABO/RH(D): A POS
ANTIBODY SCREEN: NEGATIVE

## 2018-04-21 LAB — OSMOLALITY, URINE: OSMOLALITY UR: 403 mosm/kg (ref 300–900)

## 2018-04-21 LAB — PROTIME-INR
INR: 1.27
Prothrombin Time: 15.8 seconds — ABNORMAL HIGH (ref 11.4–15.2)

## 2018-04-21 LAB — ABO/RH: ABO/RH(D): A POS

## 2018-04-21 LAB — SODIUM, URINE, RANDOM: Sodium, Ur: <10 mmol/L

## 2018-04-21 LAB — OSMOLALITY: OSMOLALITY: 277 mosm/kg (ref 275–295)

## 2018-04-21 MED ORDER — LACTULOSE 10 GM/15ML PO SOLN
20.0000 g | Freq: Every day | ORAL | Status: DC
  Administered 2018-04-22: 20 g via ORAL
  Filled 2018-04-21: qty 30

## 2018-04-21 MED ORDER — NADOLOL 20 MG PO TABS
20.0000 mg | ORAL_TABLET | Freq: Every day | ORAL | Status: DC
  Administered 2018-04-22: 20 mg via ORAL
  Filled 2018-04-21 (×2): qty 1

## 2018-04-21 MED ORDER — THIAMINE HCL 100 MG/ML IJ SOLN: 100.0000 mg | Freq: Every day | INTRAMUSCULAR | Status: DC

## 2018-04-21 MED ORDER — ONDANSETRON HCL 4 MG PO TABS: 4.0000 mg | ORAL_TABLET | Freq: Four times a day (QID) | ORAL | Status: DC | PRN

## 2018-04-21 MED ORDER — SPIRONOLACTONE 50 MG PO TABS
50.0000 mg | ORAL_TABLET | Freq: Every day | ORAL | Status: DC
  Administered 2018-04-22: 50 mg via ORAL
  Filled 2018-04-21 (×2): qty 1
  Filled 2018-04-21: qty 2

## 2018-04-21 MED ORDER — CHLORDIAZEPOXIDE HCL 5 MG PO CAPS: 5.0000 mg | ORAL_CAPSULE | Freq: Four times a day (QID) | ORAL | Status: DC | PRN

## 2018-04-21 MED ORDER — TRAMADOL HCL 50 MG PO TABS
50.0000 mg | ORAL_TABLET | Freq: Once | ORAL | Status: AC
  Administered 2018-04-22: 50 mg via ORAL
  Filled 2018-04-21: qty 1

## 2018-04-21 MED ORDER — ADULT MULTIVITAMIN W/MINERALS CH
1.0000 | ORAL_TABLET | Freq: Every day | ORAL | Status: DC
  Administered 2018-04-22: 1 via ORAL
  Filled 2018-04-21 (×2): qty 1

## 2018-04-21 MED ORDER — FOLIC ACID 1 MG PO TABS: 1.0000 mg | ORAL_TABLET | Freq: Every day | ORAL | Status: DC

## 2018-04-21 MED ORDER — POTASSIUM CHLORIDE CRYS ER 20 MEQ PO TBCR
40.0000 meq | EXTENDED_RELEASE_TABLET | Freq: Once | ORAL | Status: AC
  Administered 2018-04-21: 40 meq via ORAL
  Filled 2018-04-21: qty 2

## 2018-04-21 MED ORDER — VITAMIN B-1 100 MG PO TABS
100.0000 mg | ORAL_TABLET | Freq: Every day | ORAL | Status: DC
  Administered 2018-04-21 – 2018-04-22 (×2): 100 mg via ORAL
  Filled 2018-04-21 (×2): qty 1

## 2018-04-21 MED ORDER — UMECLIDINIUM-VILANTEROL 62.5-25 MCG/INH IN AEPB
1.0000 | INHALATION_SPRAY | Freq: Every day | RESPIRATORY_TRACT | Status: DC
  Administered 2018-04-23: 1 via RESPIRATORY_TRACT
  Filled 2018-04-21: qty 14

## 2018-04-21 MED ORDER — PANTOPRAZOLE SODIUM 20 MG PO TBEC
20.0000 mg | DELAYED_RELEASE_TABLET | Freq: Every day | ORAL | Status: DC
  Administered 2018-04-22: 20 mg via ORAL
  Filled 2018-04-21 (×2): qty 1

## 2018-04-21 MED ORDER — TECHNETIUM TC 99M-LABELED RED BLOOD CELLS IV KIT
27.2000 | PACK | Freq: Once | INTRAVENOUS | Status: AC | PRN
  Administered 2018-04-21: 27.2 via INTRAVENOUS

## 2018-04-21 MED ORDER — ONDANSETRON HCL 4 MG/2ML IJ SOLN: 4.0000 mg | Freq: Four times a day (QID) | INTRAMUSCULAR | Status: DC | PRN

## 2018-04-21 MED ORDER — SODIUM CHLORIDE 0.9 % IV SOLN
1.0000 g | INTRAVENOUS | Status: DC
  Administered 2018-04-21 – 2018-04-22 (×2): 1 g via INTRAVENOUS
  Filled 2018-04-21 (×2): qty 10

## 2018-04-21 MED ORDER — ACETAMINOPHEN 325 MG PO TABS
650.0000 mg | ORAL_TABLET | Freq: Four times a day (QID) | ORAL | Status: DC | PRN
  Filled 2018-04-21: qty 2

## 2018-04-21 MED ORDER — SODIUM CHLORIDE 0.9 % IV BOLUS
500.0000 mL | Freq: Once | INTRAVENOUS | Status: AC
  Administered 2018-04-21: 500 mL via INTRAVENOUS

## 2018-04-21 MED ORDER — FOLIC ACID 1 MG PO TABS
1.0000 mg | ORAL_TABLET | Freq: Every day | ORAL | Status: DC
  Administered 2018-04-22: 1 mg via ORAL
  Filled 2018-04-21 (×2): qty 1

## 2018-04-21 MED ORDER — SODIUM CHLORIDE 0.9 % IV BOLUS: 1000.0000 mL | Freq: Once | INTRAVENOUS | Status: DC

## 2018-04-21 NOTE — ED Triage Notes (Signed)
Pt has hx of gi bleeds, states he was at [facility] for a seizure but received blood then for a GI bleed, states he was d/c thurs then went back yesterday for rectal bleeding again, was d/c last night and has had 4 episodes of dark red rectal bleeding this am. Gi doctor is dr Laural Golden. Pt a/ox4 resp e/u, , skin pale.

## 2018-04-21 NOTE — Progress Notes (Signed)
Per dr patel , ok for patient to go to [facility] blood without nurse
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
## 2018-04-22 DIAGNOSIS — K922 Gastrointestinal hemorrhage, unspecified: Secondary | ICD-10-CM | POA: Diagnosis not present

## 2018-04-22 DIAGNOSIS — F1019 Alcohol abuse with unspecified alcohol-induced disorder: Secondary | ICD-10-CM | POA: Diagnosis present

## 2018-04-22 DIAGNOSIS — K7031 Alcoholic cirrhosis of liver with ascites: Secondary | ICD-10-CM | POA: Diagnosis present

## 2018-04-22 DIAGNOSIS — K766 Portal hypertension: Secondary | ICD-10-CM | POA: Diagnosis present

## 2018-04-22 LAB — CBC
HEMATOCRIT: 25.5 % — AB (ref 39.0–52.0)
Hemoglobin: 8.3 g/dL — ABNORMAL LOW (ref 13.0–17.0)
MCH: 32 pg (ref 26.0–34.0)
MCHC: 32.5 g/dL (ref 30.0–36.0)
MCV: 98.5 fL (ref 80.0–100.0)
Platelets: 263 10*3/uL (ref 150–400)
RBC: 2.59 MIL/uL — ABNORMAL LOW (ref 4.22–5.81)
RDW: 16.2 % — AB (ref 11.5–15.5)
WBC: 11.2 10*3/uL — ABNORMAL HIGH (ref 4.0–10.5)
nRBC: 0 % (ref 0.0–0.2)

## 2018-04-22 NOTE — Progress Notes (Signed)
GASTROENTEROLOGY PROGRESS NOTE  Problem:   Recurrent GI bleeding of indeterminate origin.  Posthemorrhagic anemia.  Alcohol abuse disorder.  History of exposure to nonsteroidal anti-inflammatory drugs.  Subjective: Patient denies bleeding for the past 48 hours.  Feels well, hungry.  No pain.  Indicates no ibuprofen exposure for approximately the past 3 to 4 weeks.  Indicates no alcohol for approximately the past 11 days.  Objective: Hemoglobin essentially stable overnight, dropping from 8.9 to a current level of 8.3.  Nuclear medicine bleeding scan performed yesterday evening was negative.  Assessment: Quiescent GI bleeding in a patient with documented portal hypertension, recent ongoing alcohol abuse, and ibuprofen exposure. Posthemorrhagic anemia, stable.  Plan: It is noted that the patient's endoscopy about a week ago showed grade 2 varices and portal hypertensive gastropathy.  No alternative source of bleeding was identified.  I discussed with the patient the option of doing a repeat capsule endoscopy (reportedly negative a year ago, report not available on epic), but in view of his stability, and the low likelihood of finding a treatable pathology on that exam, I feel the yield of such a test would be very low and the patient prefers not to have it, which I think is reasonable.  I will advance his diet since the patient is very hungry, and recheck labs tomorrow.  If stable, I think discharge tomorrow would be reasonable.    The importance of avoiding ibuprofen and avoiding alcohol were reviewed with the patient.  Cleotis Nipper, M.D. 04/22/2018 12:20 PM  Pager [phone] If no answer or after 5 PM call [phone]

## 2018-04-22 NOTE — Progress Notes (Signed)
PROGRESS NOTE    Arthur Johnson  XFG:[id number] DOB: [birth year] DOA: 04/21/2018 PCP: [name], MD   Brief Narrative:  Arthur Johnson is a 52 y.o. male with Past medical history of liver cirrhosis, COPD, diverticulosis, GERD, HTN, recent hospitalization for seizure as well as GI bleed, alcohol abuse, severe protein calorie malnutrition. Patient presented with complaints of BRBPR.  Patient was recently hospitalized for a seizure event during which she was found to have anemia as well as better blood per rectum and required blood transfusion.  There is no active bleeding identified and patient was discharged home.  Patient has been to ER twice this past week for rectal blood per rectum without any significant drop in H&H.  GI has seen the patient and noted that he had an endoscopy about a week ago which showed grade 2 varices and portal hypertensive gastropathy.  There was no alternative source of bleeding identified.  A nuclear medicine bleeding scan obtained yesterday evening was negative.  Repeat capsule endoscopy was discussed with the patient but in view of his stability there is a low likelihood of finding a treatable pathology on that exam.  Patient prefers not to have the exam and therefore GI has recommended that we advance his diet recheck labs tomorrow and if he is stable likely discharge home.   Assessment & Plan:   Principal Problem:   Lower GI bleed Active Problems:   Portal hypertension (HCC)   Acute blood loss anemia   Alcoholic cirrhosis of liver with ascites (HCC)   Alcohol abuse with alcohol-induced disorder ([place])   1.  Lower GI bleed:   Bleeding has stabilized at this point and H&H is stable.  We will continue to follow and check H&H again this evening and tomorrow morning.  Early discharge in a.m.  2.  Portal hypertension: Patient seems very frustrated about recurrent bleeding and complications.  He also has a significant amount of ascites.  He did not seem  aware that this was a problem that he would have of his life.  I discussed the progressive nature of portal hypertension and cirrhosis with the patient.  We will continue lactulose, naldolo, spironolactone, management per GI.  If ascites continues to be a problem a fluid restriction may at some point benefit the patient.  3.  Acute blood loss anemia: Patient has received 2 units of packed red blood cell on November 3.  Hemoglobin currently 8.3 not indicative of transfusion requirement.  Continue to follow and transfuse if patient symptomatic or hemoglobin less than 7.  4.  Alcoholic cirrhosis of the liver with ascites: Continue lactulose.  Patient might benefit from a "liver" diet which would be low in protein.  Will consult nutrition for further evaluation.  Continue lactulose and other medications as noted above.  5.  Alcohol abuse with alcohol induced disorder: The patient has has alcoholic cirrhosis and understands that his liver disease was caused by his alcohol use.  His last alcohol use was 11 days ago.  He was graduated on his cessation and encouraged to no longer drink alcohol.  DVT prophylaxis: SCDs Code Status: Full code (may need to be addressed in the near future) Family Communication: No family present at the time of my evaluation.  Patient retains capacity. Disposition Plan: Likely home in 24 hours if hemoglobin stable   Consultants:   Dr. Cristina Gong from [facility]  Procedures:   Nuclear medicine bleeding scan which did not show a source of bleeding  Antimicrobials:  Ceftriaxone 1 g IV daily first dose was on November 3 for S BP prophylaxis   Subjective: Patient seems calmly frustrated and concerned.  No new complaints of bleeding.  Objective: Vitals:   04/21/18 2250 04/22/18 0500 04/22/18 0527 04/22/18 1100  BP: 107/64  (!) 111/41 124/82  Pulse: 88  89 89  Resp: 14  17 16   Temp: 98.3 F (36.8 C)  98.9 F (37.2 C)   TempSrc: Oral  Oral   SpO2: 98%   (!) 7% 100%  Weight:  84 kg      Intake/Output Summary (Last 24 hours) at 04/22/2018 1353 Last data filed at 04/22/2018 0800 Gross per 24 hour  Intake 806.67 ml  Output 200 ml  Net 606.67 ml   Filed Weights   04/22/18 0500  Weight: 84 kg    Examination:  General exam: Appears calm and comfortable  Respiratory system: Clear to auscultation. Respiratory effort normal. Cardiovascular system: S1 & S2 heard, RRR. No JVD, murmurs, rubs, gallops or clicks. No pedal edema. Gastrointestinal system: Abdomen is nondistended, soft and nontender. No organomegaly or masses felt. Normal bowel sounds heard.  Basketball sized abdomen due to ascites noted Central nervous system: Alert and oriented. No focal neurological deficits. Extremities: Symmetric 5 x 5 power. Skin: No rashes, lesions or ulcers Psychiatry: Judgement and insight appear normal. Mood & affect appropriate.     Data Reviewed: I have personally reviewed following labs and imaging studies  CBC: Recent Labs  Lab 04/20/18 0950 04/20/18 1013 04/20/18 1426 04/21/18 1100 04/21/18 1958 04/22/18 0246  WBC 10.1  --  8.8 9.4 11.1* 11.2*  NEUTROABS 5.2  --   --   --   --   --   HGB 9.6* 11.2* 9.1* 8.9* 8.9* 8.3*  HCT 29.8* 33.0* 28.3* 27.5* 26.6* 25.5*  MCV 102.4*  --  101.4* 101.1* 98.9 98.5  PLT 222  --  210 236 251 644   Basic Metabolic Panel: Recent Labs  Lab 04/16/18 0456 04/17/18 0511 04/18/18 0535 04/20/18 0950 04/20/18 1013 04/21/18 1100  NA 134* 132* 134* 127* 132* 128*  K 3.3* 3.7 3.6 3.4* 3.5 3.3*  CL 106 104 106 99 98 100  CO2 22 19* 21* 21*  --  22  GLUCOSE 115* 139* 152* 172* 167* 242*  BUN 10 8 7 8  5* 6  CREATININE 0.65 0.72 0.56* 0.54* 0.60* 0.72  CALCIUM 7.9* 8.1* 8.0* 8.0*  --  7.8*  MG 1.6*  --   --   --   --   --    GFR: Estimated Creatinine Clearance: 116.6 mL/min (by C-G formula based on SCr of 0.72 mg/dL). Liver Function Tests: Recent Labs  Lab 04/16/18 0456 04/17/18 0511 04/18/18 0535  04/20/18 0950 04/21/18 1100  AST 105* 109* 118* 116* 97*  ALT 34 42 45* 54* 50*  ALKPHOS 84 93 93 117 94  BILITOT 3.3* 3.3* 2.7* 2.6* 2.8*  PROT 5.2* 5.4* 5.2* 6.3* 5.4*  ALBUMIN 2.3* 2.5* 2.4* 2.8* 2.4*    Recent Labs  Lab 04/17/18 0511  AMMONIA 70*   Coagulation Profile: Recent Labs  Lab 04/20/18 0950 04/21/18 1254  INR 1.21 1.27    Recent Results (from the past 240 hour(s))  Culture, Urine     Status: None   Collection Time: 04/15/18  2:34 PM  Result Value Ref Range Status   Specimen Description   Final    URINE, RANDOM Performed at [facility], [address] [place], [place] [address]  Special Requests   Final    NONE Performed at [facility], [address], [place], [place] [address]    Culture   Final    NO GROWTH Performed at [facility], [place] [address], [place], [place] [address]    Report Status 04/17/2018 FINAL  Final         Radiology Studies: Nm Gi Blood Loss  Result Date: 04/21/2018 CLINICAL DATA:  GI bleed. EXAM: NUCLEAR MEDICINE GASTROINTESTINAL BLEEDING SCAN TECHNIQUE: Sequential abdominal images were obtained following intravenous administration of Tc-58m labeled red blood cells. RADIOPHARMACEUTICALS:  27.2 mCi Tc-46m pertechnetate in-vitro labeled red cells. COMPARISON:  None. FINDINGS: There is significant soft tissue activity throughout the entire study. However, there is no definitive GI bleed identified. IMPRESSION: No definitive GI bleed identified. Electronically Signed   By: Dorise Bullion III M.D   On: 04/21/2018 20:39        Scheduled Meds: . folic acid  1 mg Oral Daily  . lactulose  20 g Oral Daily  . multivitamin with minerals  1 tablet Oral Daily  . nadolol  20 mg Oral Daily  . pantoprazole  20 mg Oral Daily  . spironolactone  50 mg Oral Daily  . thiamine  100 mg Oral Daily   Or  . thiamine  100 mg Intravenous Daily  . umeclidinium-vilanterol  1 puff Inhalation Daily   Continuous Infusions: . cefTRIAXone  (ROCEPHIN)  IV 1 g (04/21/18 2034)     LOS: 0 days    Time spent: 55 minutes    Lady Deutscher, MD [facility] Pager [phone]  If 7PM-7AM, please contact night-coverage [email or web address] Password [email or web address] 04/22/2018, 1:53 PM

## 2018-04-22 NOTE — Care Management Obs Status (Signed)
[facility] NOTIFICATION   Patient Details  Name: Arthur Johnson MRN: [id number] Date of Birth: May 02, 1966   Medicare Observation Status Notification Given:  Yes    Midge Minium RN, BSN, NCM-BC, ACM-RN [phone] 04/22/2018, 3:50 PM

## 2018-04-23 DIAGNOSIS — K922 Gastrointestinal hemorrhage, unspecified: Secondary | ICD-10-CM | POA: Diagnosis not present

## 2018-04-23 LAB — BASIC METABOLIC PANEL
ANION GAP: 6 (ref 5–15)
BUN: 7 mg/dL (ref 6–20)
CHLORIDE: 102 mmol/L (ref 98–111)
CO2: 22 mmol/L (ref 22–32)
Calcium: 7.8 mg/dL — ABNORMAL LOW (ref 8.9–10.3)
Creatinine, Ser: 0.76 mg/dL (ref 0.61–1.24)
GFR calc Af Amer: >60 mL/min (ref >60)
GFR calc non Af Amer: >60 mL/min (ref >60)
Glucose, Bld: 123 mg/dL — ABNORMAL HIGH (ref 70–99)
POTASSIUM: 3.9 mmol/L (ref 3.5–5.1)
SODIUM: 130 mmol/L — AB (ref 135–145)

## 2018-04-23 LAB — CBC
HCT: 23.2 % — ABNORMAL LOW (ref 39.0–52.0)
HEMOGLOBIN: 7.5 g/dL — AB (ref 13.0–17.0)
MCH: 32.1 pg (ref 26.0–34.0)
MCHC: 32.3 g/dL (ref 30.0–36.0)
MCV: 99.1 fL (ref 80.0–100.0)
Platelets: 238 10*3/uL (ref 150–400)
RBC: 2.34 MIL/uL — AB (ref 4.22–5.81)
RDW: 16.5 % — ABNORMAL HIGH (ref 11.5–15.5)
WBC: 8.5 10*3/uL (ref 4.0–10.5)
nRBC: 0 % (ref 0.0–0.2)

## 2018-04-23 MED ORDER — VITAMIN C 250 MG PO TABS: 250.0000 mg | ORAL_TABLET | Freq: Two times a day (BID) | ORAL | 0 refills | Status: DC

## 2018-04-23 MED ORDER — FERROUS SULFATE 325 (65 FE) MG PO TABS: 325.0000 mg | ORAL_TABLET | Freq: Two times a day (BID) | ORAL | 3 refills | Status: DC

## 2018-04-23 NOTE — Progress Notes (Signed)
Provider paged and made aware of hemoglobin decrease to 7.5

## 2018-04-23 NOTE — Progress Notes (Signed)
Pt removed telemetry monitor off his chest. States it keeps pulling and it bothers him. Changed electrodes and changed placement of box and patient is still not content. Telemetry box currently not on patient.

## 2018-04-23 NOTE — Plan of Care (Addendum)
Pt verbalized understanding plan of care. Pt has general knowledge and is able to manage his health related needs. Pt verbalized attempting to seize drinking for benefit of health. Diagnotic tests improve. No complications arise during patient's stay. Adequate nutrition maintained, level of anxiety decreased. Pt able to urinate without complication. No gained when deduced by constant daily weights take in the mornings. Pt progressing towards discharge.

## 2018-04-23 NOTE — Discharge Summary (Signed)
Physician Discharge Summary  Arthur Johnson MWN:[id number] DOB: [birth year] DOA: 04/21/2018  PCP: [name], MD  Admit date: 04/21/2018 Discharge date: 04/23/2018  Admitted From: *Home Disposition: Home  Recommendations for Outpatient Follow-up:  1. Follow up with PCP in 1-2 weeks 2. Please obtain BMP/CBC in one week 3.   Follow-up with Dr. Laural Golden from gastroenterology 2 weeks  Home Health: No Equipment/Devices: None Discharge Condition: Stable CODE STATUS: Full code Diet recommendation: Regular  Brief/Interim Summary: Arthur Johnson a 52 y.o.malewith Past medical history ofliver cirrhosis, COPD, diverticulosis, GERD, HTN, recent hospitalization for seizure as well as GI bleed, alcohol abuse, severe protein calorie malnutrition. Patient presented with complaints of BRBPR. Patient was recently hospitalized for a seizure event during which she was found to have anemia as well as better blood per rectum and required blood transfusion. There is no active bleeding identified and patient was discharged home. Patient has been to ER twice this past week for rectal blood per rectum without any significant drop in H&H.  GI has seen the patient and noted that he had an endoscopy about a week ago which showed grade 2 varices and portal hypertensive gastropathy.  There was no alternative source of bleeding identified.  A nuclear medicine bleeding scan obtained yesterday evening was negative.  Repeat capsule endoscopy was discussed with the patient but in view of his stability there is a low likelihood of finding a treatable pathology on that exam.    Has elected not to have that examination and GI recommended advancing diet and rechecking hemoglobin on 11 5.  This morning on the day of discharge his hemoglobin is 7.5.  Patient was offered transfusion of blood which he has declined.  He is most anxious to be discharged home and feels that he will do okay.  I discussed with him iron  supplementation.  He is not sure if he wishes to do that but I have gone ahead and prescribe the medication.  We also talked about a diet high in iron to include red meat and liver for the next couple of weeks.  His point patient does not desire to stay in the hospital any longer and will be discharged home at his request.  Discharge diagnosis, prognosis, plans, follow-up, medications and treatments discussed with the patient(or responsible party) and is in agreement with the plans as described.    Discharge Diagnoses:  Principal Problem:   Lower GI bleed Active Problems:   Portal hypertension (HCC)   Acute blood loss anemia   Alcoholic cirrhosis of liver with ascites (HCC)   Alcohol abuse with alcohol-induced disorder [facility])    Discharge Instructions  Discharge Instructions    Diet - low sodium heart healthy   Complete by:  As directed    Increase activity slowly   Complete by:  As directed      Allergies as of 04/23/2018      Reactions   Acetaminophen    Liver cirrhosis   Ibuprofen    Does take due to Hx of GI bleed   Ativan [lorazepam] Anxiety   Fidgety, gets ballistic, agitation. Didn't knock me out.        Medication List    TAKE these medications   ANORO ELLIPTA 62.5-25 MCG/INH Aepb Generic drug:  umeclidinium-vilanterol Inhale 1 puff into the lungs daily.   ferrous sulfate 325 (65 FE) MG tablet Take 1 tablet (325 mg total) by mouth 2 (two) times daily with a meal. Take with Vitamin C  folic acid 1 MG tablet Commonly known as:  FOLVITE Take 1 tablet (1 mg total) by mouth daily.   furosemide 20 MG tablet Commonly known as:  LASIX Take 1 tablet (20 mg total) by mouth daily.   lactulose 10 GM/15ML solution Commonly known as:  CHRONULAC Take 30 mLs (20 g total) by mouth daily.   multivitamin with minerals Tabs tablet Take 1 tablet by mouth daily.   nadolol 20 MG tablet Commonly known as:  CORGARD Take 1 tablet (20 mg total) by mouth daily.   ondansetron  4 MG tablet Commonly known as:  ZOFRAN Take 1 tablet (4 mg total) by mouth every 6 (six) hours as needed for nausea.   pantoprazole 20 MG tablet Commonly known as:  PROTONIX Take 1 tablet (20 mg total) by mouth daily.   spironolactone 50 MG tablet Commonly known as:  ALDACTONE Take 1 tablet (50 mg total) by mouth daily.   vitamin C 250 MG tablet Commonly known as:  ASCORBIC ACID Take 1 tablet (250 mg total) by mouth 2 (two) times daily. Take with ferrous sulfate (iron)      Follow-up Information    [name], MD Follow up in 7 day(s).   Specialty:  Internal Medicine Why:  please be seen in 7 days Contact information: [place] [address] [phone]        [name], MD. Call in 2 week(s).   Specialty:  Gastroenterology Why:  needs to be seen in 2 weeks Contact information: [address], [address] [place] [address] [phone]          Allergies  Allergen Reactions  . Acetaminophen     Liver cirrhosis  . Ibuprofen     Does take due to Hx of GI bleed  . Ativan [Lorazepam] Anxiety    Fidgety, gets ballistic, agitation. Didn't knock me out.      Consultations:  GI Dr. Cristina Gong   Procedures/Studies: Mr Brain Wo Contrast  Result Date: 04/15/2018 CLINICAL DATA:  Staring episode. History of cirrhosis and hypertension. EXAM: MRI HEAD WITHOUT CONTRAST TECHNIQUE: Multiplanar, multiecho pulse sequences of the brain and surrounding structures were obtained without intravenous contrast. COMPARISON:  CT HEAD April 14, 2013 FINDINGS: INTRACRANIAL CONTENTS: No reduced diffusion to suggest acute ischemia. No susceptibility artifact to suggest hemorrhage. Mild global parenchymal brain volume loss. No hydrocephalus. No suspicious parenchymal signal, masses, mass effect. No abnormal extra-axial fluid collections. No extra-axial masses. Normal symmetric hippocampal size, morphology and signal. VASCULAR: Normal major intracranial  vascular flow voids present at skull base. SKULL AND UPPER CERVICAL SPINE: No abnormal sellar expansion. No suspicious calvarial bone marrow signal. Craniocervical junction maintained. SINUSES/ORBITS: Mild paranasal sinus mucosal thickening. Mastoid air cells are well aerated.The included ocular globes and orbital contents are non-suspicious. OTHER: None. IMPRESSION: 1. No acute intracranial process. 2. Mild parenchymal brain volume loss, advanced for age. Electronically Signed   By: Elon Alas M.D.   On: 04/15/2018 16:01   Nm Gi Blood Loss  Result Date: 04/21/2018 CLINICAL DATA:  GI bleed. EXAM: NUCLEAR MEDICINE GASTROINTESTINAL BLEEDING SCAN TECHNIQUE: Sequential abdominal images were obtained following intravenous administration of Tc-54m labeled red blood cells. RADIOPHARMACEUTICALS:  27.2 mCi Tc-64m pertechnetate in-vitro labeled red cells. COMPARISON:  None. FINDINGS: There is significant soft tissue activity throughout the entire study. However, there is no definitive GI bleed identified. IMPRESSION: No definitive GI bleed identified. Electronically Signed   By: Dorise Bullion III M.D  On: 04/21/2018 20:39      Subjective: Patient is feeling better and requesting discharge home. Discharge Exam: Vitals:   04/23/18 0451 04/23/18 0811  BP: 120/66   Pulse: 94   Resp: 18   Temp: 98.7 F (37.1 C)   SpO2: 98% 99%   Vitals:   04/22/18 1716 04/22/18 2300 04/23/18 0451 04/23/18 0811  BP: 118/70 (!) 102/58 120/66   Pulse: 91 84 94   Resp: 16 17 18    Temp: 98.7 F (37.1 C) 98.6 F (37 C) 98.7 F (37.1 C)   TempSrc: Oral Oral    SpO2: 99% 97% 98% 99%  Weight:   84.1 kg     General: Pt is alert, awake, not in acute distress Cardiovascular: RRR, S1/S2 +, no rubs, no gallops Respiratory: CTA bilaterally, no wheezing, no rhonchi Abdominal: Soft, NT, ND, bowel sounds + Extremities: no edema, no cyanosis    The results of significant diagnostics from this hospitalization  (including imaging, microbiology, ancillary and laboratory) are listed below for reference.     Microbiology: Recent Results (from the past 240 hour(s))  Culture, Urine     Status: None   Collection Time: 04/15/18  2:34 PM  Result Value Ref Range Status   Specimen Description   Final    URINE, RANDOM Performed at [facility], [address], [place], [place] [address]    Special Requests   Final    NONE Performed at [facility], [address], [place], [place] [address]    Culture   Final    NO GROWTH Performed at [facility], [place] [address] [place], [place] [address]    Report Status 04/17/2018 FINAL  Final     Labs: BNP (last 3 results) No results for input(s): BNP in the last 8760 hours. Basic Metabolic Panel: Recent Labs  Lab 04/17/18 0511 04/18/18 0535 04/20/18 0950 04/20/18 1013 04/21/18 1100 04/23/18 0420  NA 132* 134* 127* 132* 128* 130*  K 3.7 3.6 3.4* 3.5 3.3* 3.9  CL 104 106 99 98 100 102  CO2 19* 21* 21*  --  22 22  GLUCOSE 139* 152* 172* 167* 242* 123*  BUN 8 7 8  5* 6 7  CREATININE 0.72 0.56* 0.54* 0.60* 0.72 0.76  CALCIUM 8.1* 8.0* 8.0*  --  7.8* 7.8*   Liver Function Tests: Recent Labs  Lab 04/17/18 0511 04/18/18 0535 04/20/18 0950 04/21/18 1100  AST 109* 118* 116* 97*  ALT 42 45* 54* 50*  ALKPHOS 93 93 117 94  BILITOT 3.3* 2.7* 2.6* 2.8*  PROT 5.4* 5.2* 6.3* 5.4*  ALBUMIN 2.5* 2.4* 2.8* 2.4*   No results for input(s): LIPASE, AMYLASE in the last 168 hours. Recent Labs  Lab 04/17/18 0511  AMMONIA 70*   CBC: Recent Labs  Lab 04/20/18 0950  04/20/18 1426 04/21/18 1100 04/21/18 1958 04/22/18 0246 04/23/18 0420  WBC 10.1  --  8.8 9.4 11.1* 11.2* 8.5  NEUTROABS 5.2  --   --   --   --   --   --   HGB 9.6*   < > 9.1* 8.9* 8.9* 8.3* 7.5*  HCT 29.8*   < > 28.3* 27.5* 26.6* 25.5* 23.2*  MCV 102.4*  --  101.4* 101.1* 98.9 98.5 99.1  PLT 222  --  210 236 251 263 238   < > = values in this interval not displayed.    Urinalysis    Component Value Date/Time   COLORURINE YELLOW 04/15/2018 1434  APPEARANCEUR CLEAR 04/15/2018 1434   LABSPEC 1.002 (L) 04/15/2018 1434   PHURINE 7.0 04/15/2018 1434   GLUCOSEU NEGATIVE 04/15/2018 1434   HGBUR NEGATIVE 04/15/2018 1434   BILIRUBINUR NEGATIVE 04/15/2018 1434   KETONESUR NEGATIVE 04/15/2018 1434   PROTEINUR NEGATIVE 04/15/2018 1434   UROBILINOGEN 0.2 04/22/2013 1157   NITRITE NEGATIVE 04/15/2018 [place] 04/15/2018 1434   Microbiology Recent Results (from the past 240 hour(s))  Culture, Urine     Status: None   Collection Time: 04/15/18  2:34 PM  Result Value Ref Range Status   Specimen Description   Final    URINE, RANDOM Performed at [facility], [address] [place], [place] [address]    Special Requests   Final    NONE Performed at [facility], [address], [place], [place] [address]    Culture   Final    NO GROWTH Performed at [facility], [place] [address], [place],  [address]    Report Status 04/17/2018 FINAL  Final     Time coordinating discharge: 42 minutes  SIGNED:   Lady Deutscher, MD  FACP [facility] 04/23/2018, 9:15 AM Pager   If 7PM-7AM, please contact night-coverage [email or web address] Password [email or web address]

## 2018-04-28 DIAGNOSIS — Z8669 Personal history of other diseases of the nervous system and sense organs: Secondary | ICD-10-CM

## 2018-04-28 DIAGNOSIS — F17210 Nicotine dependence, cigarettes, uncomplicated: Secondary | ICD-10-CM | POA: Diagnosis present

## 2018-04-28 DIAGNOSIS — Z86010 Personal history of colonic polyps: Secondary | ICD-10-CM

## 2018-04-28 DIAGNOSIS — I2510 Atherosclerotic heart disease of native coronary artery without angina pectoris: Secondary | ICD-10-CM | POA: Diagnosis present

## 2018-04-28 DIAGNOSIS — K767 Hepatorenal syndrome: Secondary | ICD-10-CM | POA: Diagnosis present

## 2018-04-28 DIAGNOSIS — R079 Chest pain, unspecified: Secondary | ICD-10-CM | POA: Diagnosis present

## 2018-04-28 DIAGNOSIS — Z23 Encounter for immunization: Secondary | ICD-10-CM

## 2018-04-28 DIAGNOSIS — R7989 Other specified abnormal findings of blood chemistry: Secondary | ICD-10-CM | POA: Diagnosis present

## 2018-04-28 DIAGNOSIS — I8510 Secondary esophageal varices without bleeding: Secondary | ICD-10-CM | POA: Diagnosis present

## 2018-04-28 DIAGNOSIS — E7220 Disorder of urea cycle metabolism, unspecified: Secondary | ICD-10-CM | POA: Diagnosis present

## 2018-04-28 DIAGNOSIS — N179 Acute kidney failure, unspecified: Secondary | ICD-10-CM | POA: Diagnosis present

## 2018-04-28 DIAGNOSIS — Z79899 Other long term (current) drug therapy: Secondary | ICD-10-CM

## 2018-04-28 DIAGNOSIS — E871 Hypo-osmolality and hyponatremia: Secondary | ICD-10-CM | POA: Diagnosis present

## 2018-04-28 DIAGNOSIS — K7031 Alcoholic cirrhosis of liver with ascites: Principal | ICD-10-CM | POA: Diagnosis present

## 2018-04-28 DIAGNOSIS — R600 Localized edema: Secondary | ICD-10-CM | POA: Diagnosis present

## 2018-04-28 DIAGNOSIS — I4581 Long QT syndrome: Secondary | ICD-10-CM | POA: Diagnosis present

## 2018-04-28 DIAGNOSIS — K922 Gastrointestinal hemorrhage, unspecified: Secondary | ICD-10-CM | POA: Diagnosis present

## 2018-04-28 DIAGNOSIS — I10 Essential (primary) hypertension: Secondary | ICD-10-CM | POA: Diagnosis present

## 2018-04-28 DIAGNOSIS — F10239 Alcohol dependence with withdrawal, unspecified: Secondary | ICD-10-CM | POA: Diagnosis not present

## 2018-04-28 DIAGNOSIS — Z8719 Personal history of other diseases of the digestive system: Secondary | ICD-10-CM

## 2018-04-28 DIAGNOSIS — D500 Iron deficiency anemia secondary to blood loss (chronic): Secondary | ICD-10-CM | POA: Diagnosis present

## 2018-04-28 DIAGNOSIS — E8770 Fluid overload, unspecified: Secondary | ICD-10-CM | POA: Diagnosis present

## 2018-04-28 DIAGNOSIS — J449 Chronic obstructive pulmonary disease, unspecified: Secondary | ICD-10-CM | POA: Diagnosis present

## 2018-04-28 DIAGNOSIS — Z9049 Acquired absence of other specified parts of digestive tract: Secondary | ICD-10-CM

## 2018-04-28 DIAGNOSIS — K219 Gastro-esophageal reflux disease without esophagitis: Secondary | ICD-10-CM | POA: Diagnosis present

## 2018-04-28 DIAGNOSIS — R601 Generalized edema: Secondary | ICD-10-CM | POA: Diagnosis not present

## 2018-04-28 DIAGNOSIS — K3189 Other diseases of stomach and duodenum: Secondary | ICD-10-CM | POA: Diagnosis present

## 2018-04-28 DIAGNOSIS — E876 Hypokalemia: Secondary | ICD-10-CM | POA: Diagnosis present

## 2018-04-28 DIAGNOSIS — K766 Portal hypertension: Secondary | ICD-10-CM | POA: Diagnosis present

## 2018-04-28 DIAGNOSIS — E43 Unspecified severe protein-calorie malnutrition: Secondary | ICD-10-CM | POA: Diagnosis present

## 2018-04-28 DIAGNOSIS — R000 Tachycardia, unspecified: Secondary | ICD-10-CM | POA: Diagnosis present

## 2018-04-28 DIAGNOSIS — Z5329 Procedure and treatment not carried out because of patient's decision for other reasons: Secondary | ICD-10-CM | POA: Diagnosis not present

## 2018-04-28 DIAGNOSIS — I248 Other forms of acute ischemic heart disease: Secondary | ICD-10-CM | POA: Diagnosis present

## 2018-04-28 DIAGNOSIS — J90 Pleural effusion, not elsewhere classified: Secondary | ICD-10-CM | POA: Diagnosis present

## 2018-04-29 ENCOUNTER — Other Ambulatory Visit: Payer: Self-pay

## 2018-04-29 ENCOUNTER — Emergency Department (HOSPITAL_COMMUNITY): Payer: Medicare Other

## 2018-04-29 ENCOUNTER — Encounter (HOSPITAL_COMMUNITY): Payer: Self-pay | Admitting: *Deleted

## 2018-04-29 ENCOUNTER — Observation Stay (HOSPITAL_BASED_OUTPATIENT_CLINIC_OR_DEPARTMENT_OTHER): Payer: Medicare Other

## 2018-04-29 ENCOUNTER — Inpatient Hospital Stay (HOSPITAL_COMMUNITY)
Admission: EM | Admit: 2018-04-29 | Discharge: 2018-05-02 | DRG: 432 | Discharge status: Against Medical Advice | Payer: Medicare Other | Source: Other Acute Inpatient Hospital | Attending: Internal Medicine | Admitting: Internal Medicine

## 2018-04-29 DIAGNOSIS — K7031 Alcoholic cirrhosis of liver with ascites: Principal | ICD-10-CM

## 2018-04-29 DIAGNOSIS — R601 Generalized edema: Secondary | ICD-10-CM | POA: Diagnosis not present

## 2018-04-29 DIAGNOSIS — I214 Non-ST elevation (NSTEMI) myocardial infarction: Secondary | ICD-10-CM

## 2018-04-29 DIAGNOSIS — R079 Chest pain, unspecified: Secondary | ICD-10-CM

## 2018-04-29 DIAGNOSIS — R9431 Abnormal electrocardiogram [ECG] [EKG]: Secondary | ICD-10-CM | POA: Insufficient documentation

## 2018-04-29 DIAGNOSIS — I200 Unstable angina: Secondary | ICD-10-CM

## 2018-04-29 DIAGNOSIS — J449 Chronic obstructive pulmonary disease, unspecified: Secondary | ICD-10-CM

## 2018-04-29 DIAGNOSIS — I213 ST elevation (STEMI) myocardial infarction of unspecified site: Secondary | ICD-10-CM

## 2018-04-29 DIAGNOSIS — R188 Other ascites: Secondary | ICD-10-CM

## 2018-04-29 DIAGNOSIS — F1020 Alcohol dependence, uncomplicated: Secondary | ICD-10-CM

## 2018-04-29 DIAGNOSIS — D649 Anemia, unspecified: Secondary | ICD-10-CM

## 2018-04-29 DIAGNOSIS — F1010 Alcohol abuse, uncomplicated: Secondary | ICD-10-CM | POA: Diagnosis present

## 2018-04-29 DIAGNOSIS — E876 Hypokalemia: Secondary | ICD-10-CM

## 2018-04-29 DIAGNOSIS — I10 Essential (primary) hypertension: Secondary | ICD-10-CM | POA: Diagnosis present

## 2018-04-29 DIAGNOSIS — E43 Unspecified severe protein-calorie malnutrition: Secondary | ICD-10-CM

## 2018-04-29 DIAGNOSIS — E871 Hypo-osmolality and hyponatremia: Secondary | ICD-10-CM | POA: Diagnosis present

## 2018-04-29 DIAGNOSIS — K219 Gastro-esophageal reflux disease without esophagitis: Secondary | ICD-10-CM

## 2018-04-29 LAB — CBC WITH DIFFERENTIAL/PLATELET
ABS IMMATURE GRANULOCYTES: 0.04 10*3/uL (ref 0.00–0.07)
BASOS PCT: 1 %
Basophils Absolute: 0.1 10*3/uL (ref 0.0–0.1)
EOS ABS: 0.2 10*3/uL (ref 0.0–0.5)
Eosinophils Relative: 2 %
HCT: 24.5 % — ABNORMAL LOW (ref 39.0–52.0)
Hemoglobin: 7.9 g/dL — ABNORMAL LOW (ref 13.0–17.0)
Immature Granulocytes: 0 %
Lymphocytes Relative: 17 %
Lymphs Abs: 1.6 10*3/uL (ref 0.7–4.0)
MCH: 30.2 pg (ref 26.0–34.0)
MCHC: 32.2 g/dL (ref 30.0–36.0)
MCV: 93.5 fL (ref 80.0–100.0)
MONO ABS: 1.2 10*3/uL — AB (ref 0.1–1.0)
Monocytes Relative: 13 %
NEUTROS PCT: 67 %
Neutro Abs: 6.4 10*3/uL (ref 1.7–7.7)
PLATELETS: 314 10*3/uL (ref 150–400)
RBC: 2.62 MIL/uL — AB (ref 4.22–5.81)
RDW: 16.7 % — AB (ref 11.5–15.5)
WBC: 9.5 10*3/uL (ref 4.0–10.5)
nRBC: 0 % (ref 0.0–0.2)

## 2018-04-29 LAB — PREPARE RBC (CROSSMATCH)

## 2018-04-29 LAB — BASIC METABOLIC PANEL
ANION GAP: 10 (ref 5–15)
Anion gap: 9 (ref 5–15)
BUN: 41 mg/dL — AB (ref 6–20)
BUN: <5 mg/dL — ABNORMAL LOW (ref 6–20)
CALCIUM: 8.9 mg/dL (ref 8.9–10.3)
CO2: 28 mmol/L (ref 22–32)
CO2: 21 mmol/L — ABNORMAL LOW (ref 22–32)
CREATININE: 2.4 mg/dL — AB (ref 0.61–1.24)
Calcium: 7.8 mg/dL — ABNORMAL LOW (ref 8.9–10.3)
Chloride: 102 mmol/L (ref 98–111)
Chloride: 100 mmol/L (ref 98–111)
Creatinine, Ser: 0.96 mg/dL (ref 0.61–1.24)
GFR calc Af Amer: >60 mL/min (ref >60)
GFR calc non Af Amer: 30 mL/min — ABNORMAL LOW (ref >60)
GFR, EST AFRICAN AMERICAN: 35 mL/min — AB (ref >60)
GLUCOSE: 220 mg/dL — AB (ref 70–99)
Glucose, Bld: 247 mg/dL — ABNORMAL HIGH (ref 70–99)
POTASSIUM: 3.7 mmol/L (ref 3.5–5.1)
Potassium: 3.7 mmol/L (ref 3.5–5.1)
SODIUM: 139 mmol/L (ref 135–145)
Sodium: 131 mmol/L — ABNORMAL LOW (ref 135–145)

## 2018-04-29 LAB — CBC
HCT: 25.5 % — ABNORMAL LOW (ref 39.0–52.0)
HCT: 25.4 % — ABNORMAL LOW (ref 39.0–52.0)
Hemoglobin: 8.7 g/dL — ABNORMAL LOW (ref 13.0–17.0)
Hemoglobin: 8.7 g/dL — ABNORMAL LOW (ref 13.0–17.0)
MCH: 31 pg (ref 26.0–34.0)
MCH: 30.9 pg (ref 26.0–34.0)
MCHC: 34.1 g/dL (ref 30.0–36.0)
MCHC: 34.3 g/dL (ref 30.0–36.0)
MCV: 90.7 fL (ref 80.0–100.0)
MCV: 90.1 fL (ref 80.0–100.0)
NRBC: 0 % (ref 0.0–0.2)
NRBC: 0 % (ref 0.0–0.2)
PLATELETS: 280 10*3/uL (ref 150–400)
PLATELETS: 280 10*3/uL (ref 150–400)
RBC: 2.81 MIL/uL — ABNORMAL LOW (ref 4.22–5.81)
RBC: 2.82 MIL/uL — AB (ref 4.22–5.81)
RDW: 17.8 % — AB (ref 11.5–15.5)
RDW: 17.8 % — ABNORMAL HIGH (ref 11.5–15.5)
WBC: 8.7 10*3/uL (ref 4.0–10.5)
WBC: 8 10*3/uL (ref 4.0–10.5)

## 2018-04-29 LAB — TROPONIN I
Troponin I: <0.03 ng/mL (ref <0.03)
Troponin I: 0.09 ng/mL (ref <0.03)
Troponin I: <0.03 ng/mL (ref <0.03)

## 2018-04-29 LAB — LIPASE, BLOOD: LIPASE: 62 U/L — AB (ref 11–51)

## 2018-04-29 LAB — ECHOCARDIOGRAM COMPLETE
Height: 68 in
WEIGHTICAEL: 3058.22 [oz_av]

## 2018-04-29 LAB — COMPREHENSIVE METABOLIC PANEL
ALT: 40 U/L (ref 0–44)
ANION GAP: 6 (ref 5–15)
AST: 73 U/L — ABNORMAL HIGH (ref 15–41)
Albumin: 2.4 g/dL — ABNORMAL LOW (ref 3.5–5.0)
Alkaline Phosphatase: 105 U/L (ref 38–126)
BUN: <5 mg/dL — ABNORMAL LOW (ref 6–20)
CHLORIDE: 95 mmol/L — AB (ref 98–111)
CO2: 23 mmol/L (ref 22–32)
Calcium: 7.7 mg/dL — ABNORMAL LOW (ref 8.9–10.3)
Creatinine, Ser: 0.59 mg/dL — ABNORMAL LOW (ref 0.61–1.24)
Glucose, Bld: 189 mg/dL — ABNORMAL HIGH (ref 70–99)
POTASSIUM: 3.2 mmol/L — AB (ref 3.5–5.1)
SODIUM: 124 mmol/L — AB (ref 135–145)
Total Bilirubin: 2 mg/dL — ABNORMAL HIGH (ref 0.3–1.2)
Total Protein: 5.9 g/dL — ABNORMAL LOW (ref 6.5–8.1)

## 2018-04-29 LAB — MRSA PCR SCREENING: MRSA BY PCR: NEGATIVE

## 2018-04-29 LAB — LIPID PANEL
CHOL/HDL RATIO: 3.2 ratio
CHOLESTEROL: 120 mg/dL (ref 0–200)
HDL: 37 mg/dL — AB (ref >40)
LDL Cholesterol: 67 mg/dL (ref 0–99)
TRIGLYCERIDES: 79 mg/dL (ref <150)
VLDL: 16 mg/dL (ref 0–40)

## 2018-04-29 LAB — PROTIME-INR
INR: 1.26
PROTHROMBIN TIME: 15.7 s — AB (ref 11.4–15.2)

## 2018-04-29 LAB — MAGNESIUM: Magnesium: 1.7 mg/dL (ref 1.7–2.4)

## 2018-04-29 LAB — CK: CK TOTAL: 59 U/L (ref 49–397)

## 2018-04-29 LAB — APTT: aPTT: 33 seconds (ref 24–36)

## 2018-04-29 MED ORDER — ADULT MULTIVITAMIN W/MINERALS CH: 1.0000 | ORAL_TABLET | Freq: Every day | ORAL | Status: DC

## 2018-04-29 MED ORDER — ADULT MULTIVITAMIN W/MINERALS CH
1.0000 | ORAL_TABLET | Freq: Every day | ORAL | Status: DC
  Administered 2018-04-29 – 2018-05-02 (×4): 1 via ORAL
  Filled 2018-04-29 (×4): qty 1

## 2018-04-29 MED ORDER — NADOLOL 20 MG PO TABS
20.0000 mg | ORAL_TABLET | Freq: Every day | ORAL | Status: DC
  Administered 2018-04-29 – 2018-05-02 (×4): 20 mg via ORAL
  Filled 2018-04-29 (×4): qty 1

## 2018-04-29 MED ORDER — VITAMIN C 500 MG PO TABS
250.0000 mg | ORAL_TABLET | Freq: Two times a day (BID) | ORAL | Status: DC
  Administered 2018-04-29 – 2018-05-02 (×7): 250 mg via ORAL
  Filled 2018-04-29 (×7): qty 1

## 2018-04-29 MED ORDER — SODIUM CHLORIDE 0.9 % IV SOLN: INTRAVENOUS | Status: DC

## 2018-04-29 MED ORDER — PNEUMOCOCCAL VAC POLYVALENT 25 MCG/0.5ML IJ INJ
0.5000 mL | INJECTION | INTRAMUSCULAR | Status: AC
  Administered 2018-05-01: 0.5 mL via INTRAMUSCULAR
  Filled 2018-04-29: qty 0.5

## 2018-04-29 MED ORDER — SPIRONOLACTONE 25 MG PO TABS
50.0000 mg | ORAL_TABLET | Freq: Every day | ORAL | Status: DC
  Administered 2018-04-29: 50 mg via ORAL
  Filled 2018-04-29: qty 2

## 2018-04-29 MED ORDER — POTASSIUM CHLORIDE CRYS ER 20 MEQ PO TBCR
40.0000 meq | EXTENDED_RELEASE_TABLET | Freq: Once | ORAL | Status: AC
  Administered 2018-04-29: 40 meq via ORAL
  Filled 2018-04-29: qty 2

## 2018-04-29 MED ORDER — FOLIC ACID 1 MG PO TABS: 1.0000 mg | ORAL_TABLET | Freq: Every day | ORAL | Status: DC

## 2018-04-29 MED ORDER — NITROGLYCERIN 0.4 MG SL SUBL
0.4000 mg | SUBLINGUAL_TABLET | SUBLINGUAL | Status: DC | PRN
  Administered 2018-04-29: 0.4 mg via SUBLINGUAL
  Filled 2018-04-29: qty 1

## 2018-04-29 MED ORDER — ONDANSETRON HCL 4 MG/2ML IJ SOLN
4.0000 mg | Freq: Once | INTRAMUSCULAR | Status: AC
  Administered 2018-04-29: 4 mg via INTRAVENOUS
  Filled 2018-04-29: qty 2

## 2018-04-29 MED ORDER — INFLUENZA VAC SPLIT QUAD 0.5 ML IM SUSY
0.5000 mL | PREFILLED_SYRINGE | INTRAMUSCULAR | Status: AC
  Administered 2018-05-01: 0.5 mL via INTRAMUSCULAR
  Filled 2018-04-29: qty 0.5

## 2018-04-29 MED ORDER — MORPHINE SULFATE (PF) 2 MG/ML IV SOLN
2.0000 mg | Freq: Once | INTRAVENOUS | Status: AC
  Administered 2018-04-29: 2 mg via INTRAVENOUS
  Filled 2018-04-29: qty 1

## 2018-04-29 MED ORDER — PANTOPRAZOLE SODIUM 20 MG PO TBEC
20.0000 mg | DELAYED_RELEASE_TABLET | Freq: Every day | ORAL | Status: DC
  Administered 2018-04-29 – 2018-05-02 (×4): 20 mg via ORAL
  Filled 2018-04-29 (×4): qty 1

## 2018-04-29 MED ORDER — MIDODRINE HCL 5 MG PO TABS
10.0000 mg | ORAL_TABLET | Freq: Three times a day (TID) | ORAL | Status: DC
  Administered 2018-04-29: 10 mg via ORAL
  Filled 2018-04-29: qty 2

## 2018-04-29 MED ORDER — SODIUM CHLORIDE 0.9% IV SOLUTION: Freq: Once | INTRAVENOUS | Status: DC

## 2018-04-29 MED ORDER — FUROSEMIDE 10 MG/ML IJ SOLN
80.0000 mg | Freq: Once | INTRAMUSCULAR | Status: AC
  Administered 2018-04-29: 80 mg via INTRAVENOUS
  Filled 2018-04-29: qty 8

## 2018-04-29 MED ORDER — SODIUM CHLORIDE 0.9% IV SOLUTION
Freq: Once | INTRAVENOUS | Status: AC
  Administered 2018-04-29: 11:00:00 via INTRAVENOUS

## 2018-04-29 MED ORDER — OCTREOTIDE ACETATE 100 MCG/ML IJ SOLN
100.0000 ug | Freq: Three times a day (TID) | INTRAMUSCULAR | Status: DC
  Administered 2018-04-29 (×2): 100 ug via SUBCUTANEOUS
  Filled 2018-04-29 (×3): qty 1

## 2018-04-29 MED ORDER — LORAZEPAM 2 MG/ML IJ SOLN: 1.0000 mg | Freq: Four times a day (QID) | INTRAMUSCULAR | Status: DC | PRN

## 2018-04-29 MED ORDER — CHLORDIAZEPOXIDE HCL 25 MG PO CAPS: 25.0000 mg | ORAL_CAPSULE | Freq: Four times a day (QID) | ORAL | Status: AC | PRN

## 2018-04-29 MED ORDER — LACTULOSE 10 GM/15ML PO SOLN
20.0000 g | Freq: Every day | ORAL | Status: DC
  Administered 2018-04-29 – 2018-05-01 (×3): 20 g via ORAL
  Filled 2018-04-29 (×3): qty 30

## 2018-04-29 MED ORDER — ALBUMIN HUMAN 25 % IV SOLN
12.5000 g | Freq: Four times a day (QID) | INTRAVENOUS | Status: DC
  Administered 2018-04-29 – 2018-04-30 (×3): 12.5 g via INTRAVENOUS
  Filled 2018-04-29 (×4): qty 50

## 2018-04-29 MED ORDER — FERROUS SULFATE 325 (65 FE) MG PO TABS
325.0000 mg | ORAL_TABLET | Freq: Two times a day (BID) | ORAL | Status: DC
  Administered 2018-04-29 – 2018-05-02 (×5): 325 mg via ORAL
  Filled 2018-04-29 (×6): qty 1

## 2018-04-29 MED ORDER — THIAMINE HCL 100 MG/ML IJ SOLN
100.0000 mg | Freq: Every day | INTRAMUSCULAR | Status: DC
  Filled 2018-04-29: qty 2

## 2018-04-29 MED ORDER — VITAMIN B-1 100 MG PO TABS
100.0000 mg | ORAL_TABLET | Freq: Every day | ORAL | Status: DC
  Administered 2018-04-29 – 2018-05-02 (×4): 100 mg via ORAL
  Filled 2018-04-29 (×4): qty 1

## 2018-04-29 MED ORDER — SODIUM CHLORIDE 0.9 % IV BOLUS
250.0000 mL | Freq: Once | INTRAVENOUS | Status: AC
  Administered 2018-04-29: 250 mL via INTRAVENOUS

## 2018-04-29 MED ORDER — OXYCODONE HCL 5 MG PO TABS
5.0000 mg | ORAL_TABLET | ORAL | Status: DC | PRN
  Administered 2018-04-29 – 2018-05-02 (×2): 5 mg via ORAL
  Filled 2018-04-29 (×2): qty 1

## 2018-04-29 MED ORDER — UMECLIDINIUM-VILANTEROL 62.5-25 MCG/INH IN AEPB
1.0000 | INHALATION_SPRAY | Freq: Every day | RESPIRATORY_TRACT | Status: DC
  Administered 2018-04-29 – 2018-05-02 (×4): 1 via RESPIRATORY_TRACT
  Filled 2018-04-29: qty 14

## 2018-04-29 MED ORDER — LORAZEPAM 1 MG PO TABS: 1.0000 mg | ORAL_TABLET | Freq: Four times a day (QID) | ORAL | Status: DC | PRN

## 2018-04-29 MED ORDER — FOLIC ACID 1 MG PO TABS
1.0000 mg | ORAL_TABLET | Freq: Every day | ORAL | Status: DC
  Administered 2018-04-29 – 2018-05-02 (×4): 1 mg via ORAL
  Filled 2018-04-29 (×4): qty 1

## 2018-04-29 NOTE — Progress Notes (Signed)
  Echocardiogram 2D Echocardiogram has been performed.  Arthur Johnson 04/29/2018, 8:30 AM

## 2018-04-29 NOTE — ED Triage Notes (Addendum)
Pt states that he was recently discharged from the hospital, continues to have swelling to legs and abd area, has hx of cirrhosis, pt also lower abd and bilateral leg pain and sob over the past few days,

## 2018-04-29 NOTE — ED Notes (Signed)
Ex wife would like an update at [phone]
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
## 2018-04-29 NOTE — Progress Notes (Signed)
Bladder scan done- 371. md made aware.
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
## 2018-04-29 NOTE — ED Notes (Signed)
Pt c/o mid center chest pain that pt describes as "acid reflux" that radiates to bilateral arms, ekg performed given to Dr Tomi Bamberger,

## 2018-04-30 ENCOUNTER — Observation Stay (HOSPITAL_COMMUNITY): Payer: Medicare Other

## 2018-04-30 ENCOUNTER — Encounter (HOSPITAL_COMMUNITY): Payer: Self-pay | Admitting: Physician Assistant

## 2018-04-30 DIAGNOSIS — R601 Generalized edema: Secondary | ICD-10-CM

## 2018-04-30 DIAGNOSIS — K922 Gastrointestinal hemorrhage, unspecified: Secondary | ICD-10-CM | POA: Diagnosis present

## 2018-04-30 DIAGNOSIS — Z23 Encounter for immunization: Secondary | ICD-10-CM | POA: Diagnosis present

## 2018-04-30 DIAGNOSIS — E8770 Fluid overload, unspecified: Secondary | ICD-10-CM | POA: Diagnosis present

## 2018-04-30 DIAGNOSIS — E7220 Disorder of urea cycle metabolism, unspecified: Secondary | ICD-10-CM | POA: Diagnosis present

## 2018-04-30 DIAGNOSIS — F1029 Alcohol dependence with unspecified alcohol-induced disorder: Secondary | ICD-10-CM

## 2018-04-30 DIAGNOSIS — Z5329 Procedure and treatment not carried out because of patient's decision for other reasons: Secondary | ICD-10-CM | POA: Diagnosis not present

## 2018-04-30 DIAGNOSIS — F10239 Alcohol dependence with withdrawal, unspecified: Secondary | ICD-10-CM | POA: Diagnosis not present

## 2018-04-30 DIAGNOSIS — I248 Other forms of acute ischemic heart disease: Secondary | ICD-10-CM | POA: Diagnosis present

## 2018-04-30 DIAGNOSIS — K7031 Alcoholic cirrhosis of liver with ascites: Secondary | ICD-10-CM | POA: Diagnosis present

## 2018-04-30 DIAGNOSIS — K767 Hepatorenal syndrome: Secondary | ICD-10-CM | POA: Diagnosis present

## 2018-04-30 DIAGNOSIS — I8510 Secondary esophageal varices without bleeding: Secondary | ICD-10-CM | POA: Diagnosis present

## 2018-04-30 DIAGNOSIS — F17210 Nicotine dependence, cigarettes, uncomplicated: Secondary | ICD-10-CM | POA: Diagnosis present

## 2018-04-30 DIAGNOSIS — D649 Anemia, unspecified: Secondary | ICD-10-CM | POA: Diagnosis not present

## 2018-04-30 DIAGNOSIS — E43 Unspecified severe protein-calorie malnutrition: Secondary | ICD-10-CM | POA: Diagnosis present

## 2018-04-30 DIAGNOSIS — R079 Chest pain, unspecified: Secondary | ICD-10-CM | POA: Diagnosis not present

## 2018-04-30 DIAGNOSIS — R600 Localized edema: Secondary | ICD-10-CM | POA: Diagnosis present

## 2018-04-30 DIAGNOSIS — J449 Chronic obstructive pulmonary disease, unspecified: Secondary | ICD-10-CM | POA: Diagnosis present

## 2018-04-30 DIAGNOSIS — J90 Pleural effusion, not elsewhere classified: Secondary | ICD-10-CM | POA: Diagnosis present

## 2018-04-30 DIAGNOSIS — K219 Gastro-esophageal reflux disease without esophagitis: Secondary | ICD-10-CM | POA: Diagnosis present

## 2018-04-30 DIAGNOSIS — I10 Essential (primary) hypertension: Secondary | ICD-10-CM | POA: Diagnosis not present

## 2018-04-30 DIAGNOSIS — E871 Hypo-osmolality and hyponatremia: Secondary | ICD-10-CM | POA: Diagnosis present

## 2018-04-30 DIAGNOSIS — I2510 Atherosclerotic heart disease of native coronary artery without angina pectoris: Secondary | ICD-10-CM | POA: Diagnosis present

## 2018-04-30 DIAGNOSIS — N179 Acute kidney failure, unspecified: Secondary | ICD-10-CM | POA: Diagnosis present

## 2018-04-30 DIAGNOSIS — K766 Portal hypertension: Secondary | ICD-10-CM | POA: Diagnosis present

## 2018-04-30 DIAGNOSIS — K3189 Other diseases of stomach and duodenum: Secondary | ICD-10-CM | POA: Diagnosis present

## 2018-04-30 DIAGNOSIS — E876 Hypokalemia: Secondary | ICD-10-CM | POA: Diagnosis present

## 2018-04-30 DIAGNOSIS — D500 Iron deficiency anemia secondary to blood loss (chronic): Secondary | ICD-10-CM | POA: Diagnosis present

## 2018-04-30 HISTORY — PX: IR PARACENTESIS: IMG2679

## 2018-04-30 LAB — SODIUM, URINE, RANDOM

## 2018-04-30 LAB — AMMONIA: AMMONIA: 88 umol/L — AB (ref 9–35)

## 2018-04-30 LAB — HEPATIC FUNCTION PANEL
ALT: 34 U/L (ref 0–44)
AST: 66 U/L — ABNORMAL HIGH (ref 15–41)
Albumin: 2.4 g/dL — ABNORMAL LOW (ref 3.5–5.0)
Alkaline Phosphatase: 87 U/L (ref 38–126)
BILIRUBIN INDIRECT: 1.9 mg/dL — AB (ref 0.3–0.9)
Bilirubin, Direct: 1.3 mg/dL — ABNORMAL HIGH (ref 0.0–0.2)
Total Bilirubin: 3.2 mg/dL — ABNORMAL HIGH (ref 0.3–1.2)
Total Protein: 5.3 g/dL — ABNORMAL LOW (ref 6.5–8.1)

## 2018-04-30 LAB — CREATININE, URINE, RANDOM: Creatinine, Urine: 149.28 mg/dL

## 2018-04-30 LAB — CBC
HCT: 25.9 % — ABNORMAL LOW (ref 39.0–52.0)
Hemoglobin: 8.7 g/dL — ABNORMAL LOW (ref 13.0–17.0)
MCH: 30.3 pg (ref 26.0–34.0)
MCHC: 33.6 g/dL (ref 30.0–36.0)
MCV: 90.2 fL (ref 80.0–100.0)
PLATELETS: 283 10*3/uL (ref 150–400)
RBC: 2.87 MIL/uL — ABNORMAL LOW (ref 4.22–5.81)
RDW: 17.9 % — AB (ref 11.5–15.5)
WBC: 8.8 10*3/uL (ref 4.0–10.5)
nRBC: 0 % (ref 0.0–0.2)

## 2018-04-30 LAB — TROPONIN I: Troponin I: <0.03 ng/mL (ref <0.03)

## 2018-04-30 LAB — BASIC METABOLIC PANEL
Anion gap: 6 (ref 5–15)
CALCIUM: 8 mg/dL — AB (ref 8.9–10.3)
CO2: 24 mmol/L (ref 22–32)
CREATININE: 0.85 mg/dL (ref 0.61–1.24)
Chloride: 102 mmol/L (ref 98–111)
GFR calc non Af Amer: >60 mL/min (ref >60)
Glucose, Bld: 131 mg/dL — ABNORMAL HIGH (ref 70–99)
Potassium: 3.5 mmol/L (ref 3.5–5.1)
SODIUM: 132 mmol/L — AB (ref 135–145)

## 2018-04-30 LAB — BODY FLUID CELL COUNT WITH DIFFERENTIAL
LYMPHS FL: 32 %
Monocyte-Macrophage-Serous Fluid: 41 % — ABNORMAL LOW (ref 50–90)
Neutrophil Count, Fluid: 27 % — ABNORMAL HIGH (ref 0–25)
Total Nucleated Cell Count, Fluid: 230 cu mm (ref 0–1000)

## 2018-04-30 LAB — GRAM STAIN

## 2018-04-30 MED ORDER — LIDOCAINE HCL (PF) 1 % IJ SOLN
INTRAMUSCULAR | Status: AC | PRN
  Administered 2018-04-30: 10 mL

## 2018-04-30 MED ORDER — LIDOCAINE HCL 1 % IJ SOLN
INTRAMUSCULAR | Status: AC
  Filled 2018-04-30: qty 20

## 2018-04-30 MED ORDER — FUROSEMIDE 10 MG/ML IJ SOLN
40.0000 mg | Freq: Two times a day (BID) | INTRAMUSCULAR | Status: DC
  Administered 2018-04-30 – 2018-05-01 (×3): 40 mg via INTRAVENOUS
  Filled 2018-04-30 (×3): qty 4

## 2018-04-30 MED ORDER — ALBUMIN HUMAN 25 % IV SOLN
12.5000 g | Freq: Once | INTRAVENOUS | Status: AC | PRN
  Administered 2018-04-30: 12.5 g via INTRAVENOUS
  Filled 2018-04-30: qty 50

## 2018-04-30 MED ORDER — SPIRONOLACTONE 25 MG PO TABS
100.0000 mg | ORAL_TABLET | Freq: Every day | ORAL | Status: DC
  Administered 2018-04-30 – 2018-05-02 (×3): 100 mg via ORAL
  Filled 2018-04-30 (×3): qty 4

## 2018-04-30 NOTE — Progress Notes (Signed)
Transported to radiology for paracentesis awake and alert.
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
## 2018-04-30 NOTE — Progress Notes (Signed)
Right abd. post paracentesis site with band aid dry and intact. No bleeding noted.

## 2018-05-01 ENCOUNTER — Inpatient Hospital Stay (HOSPITAL_COMMUNITY): Payer: Medicare Other

## 2018-05-01 DIAGNOSIS — R079 Chest pain, unspecified: Secondary | ICD-10-CM

## 2018-05-01 DIAGNOSIS — D649 Anemia, unspecified: Secondary | ICD-10-CM

## 2018-05-01 DIAGNOSIS — E871 Hypo-osmolality and hyponatremia: Secondary | ICD-10-CM

## 2018-05-01 LAB — BASIC METABOLIC PANEL
Anion gap: 6 (ref 5–15)
BUN: 5 mg/dL — AB (ref 6–20)
CALCIUM: 7.9 mg/dL — AB (ref 8.9–10.3)
CO2: 25 mmol/L (ref 22–32)
Chloride: 101 mmol/L (ref 98–111)
Creatinine, Ser: 0.88 mg/dL (ref 0.61–1.24)
GFR calc Af Amer: >60 mL/min (ref >60)
Glucose, Bld: 115 mg/dL — ABNORMAL HIGH (ref 70–99)
POTASSIUM: 3.3 mmol/L — AB (ref 3.5–5.1)
SODIUM: 132 mmol/L — AB (ref 135–145)

## 2018-05-01 LAB — CBC
HCT: 23.9 % — ABNORMAL LOW (ref 39.0–52.0)
Hemoglobin: 7.7 g/dL — ABNORMAL LOW (ref 13.0–17.0)
MCH: 29.4 pg (ref 26.0–34.0)
MCHC: 32.2 g/dL (ref 30.0–36.0)
MCV: 91.2 fL (ref 80.0–100.0)
NRBC: 0 % (ref 0.0–0.2)
PLATELETS: 244 10*3/uL (ref 150–400)
RBC: 2.62 MIL/uL — AB (ref 4.22–5.81)
RDW: 17.8 % — ABNORMAL HIGH (ref 11.5–15.5)
WBC: 9.4 10*3/uL (ref 4.0–10.5)

## 2018-05-01 LAB — HEPATIC FUNCTION PANEL
ALBUMIN: 2.2 g/dL — AB (ref 3.5–5.0)
ALT: 30 U/L (ref 0–44)
AST: 55 U/L — AB (ref 15–41)
Alkaline Phosphatase: 69 U/L (ref 38–126)
Bilirubin, Direct: 1 mg/dL — ABNORMAL HIGH (ref 0.0–0.2)
Indirect Bilirubin: 1.1 mg/dL — ABNORMAL HIGH (ref 0.3–0.9)
TOTAL PROTEIN: 4.7 g/dL — AB (ref 6.5–8.1)
Total Bilirubin: 2.1 mg/dL — ABNORMAL HIGH (ref 0.3–1.2)

## 2018-05-01 LAB — PREPARE RBC (CROSSMATCH)

## 2018-05-01 MED ORDER — SODIUM CHLORIDE 0.9% IV SOLUTION
Freq: Once | INTRAVENOUS | Status: AC
  Administered 2018-05-01: 08:00:00 via INTRAVENOUS

## 2018-05-01 MED ORDER — NITROGLYCERIN 0.4 MG SL SUBL
SUBLINGUAL_TABLET | SUBLINGUAL | Status: AC
  Administered 2018-05-01: 16:00:00
  Filled 2018-05-01: qty 2

## 2018-05-01 MED ORDER — NITROGLYCERIN 0.4 MG SL SUBL
0.8000 mg | SUBLINGUAL_TABLET | Freq: Once | SUBLINGUAL | Status: AC
  Administered 2018-05-01: 0.8 mg via SUBLINGUAL

## 2018-05-01 MED ORDER — POTASSIUM CHLORIDE CRYS ER 20 MEQ PO TBCR
40.0000 meq | EXTENDED_RELEASE_TABLET | Freq: Once | ORAL | Status: AC
  Administered 2018-05-01: 40 meq via ORAL
  Filled 2018-05-01: qty 2

## 2018-05-01 MED ORDER — FUROSEMIDE 10 MG/ML IJ SOLN
40.0000 mg | Freq: Three times a day (TID) | INTRAMUSCULAR | Status: DC
  Administered 2018-05-01 – 2018-05-02 (×3): 40 mg via INTRAVENOUS
  Filled 2018-05-01 (×3): qty 4

## 2018-05-01 MED ORDER — METOPROLOL TARTRATE 5 MG/5ML IV SOLN
5.0000 mg | Freq: Once | INTRAVENOUS | Status: AC
  Administered 2018-05-01: 5 mg via INTRAVENOUS
  Filled 2018-05-01: qty 5

## 2018-05-01 MED ORDER — IOPAMIDOL (ISOVUE-370) INJECTION 76%
100.0000 mL | Freq: Once | INTRAVENOUS | Status: AC | PRN
  Administered 2018-05-01: 100 mL via INTRAVENOUS

## 2018-05-01 MED ORDER — LACTULOSE 10 GM/15ML PO SOLN
30.0000 g | Freq: Three times a day (TID) | ORAL | Status: DC
  Administered 2018-05-01 – 2018-05-02 (×3): 30 g via ORAL
  Filled 2018-05-01 (×3): qty 45
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
## 2018-05-01 NOTE — Progress Notes (Signed)
Progress Note  Patient Name: Arthur Johnson Date of Encounter: 05/01/2018  Primary Cardiologist: new -Dr. Radford Pax  Subjective   Very irritated, wish to get out of the hospital. "I am calling my x-wife, if she can't take me, I will call a taxi"  Inpatient Medications    Scheduled Meds: . sodium chloride   Intravenous Once  . ferrous sulfate  325 mg Oral BID WC  . folic acid  1 mg Oral Daily  . furosemide  40 mg Intravenous BID  . Influenza vac split quadrivalent PF  0.5 mL Intramuscular Tomorrow-1000  . lactulose  20 g Oral Daily  . multivitamin with minerals  1 tablet Oral Daily  . nadolol  20 mg Oral Daily  . pantoprazole  20 mg Oral Daily  . pneumococcal 23 Johnson vaccine  0.5 mL Intramuscular Tomorrow-1000  . spironolactone  100 mg Oral Daily  . thiamine  100 mg Oral Daily   Or  . thiamine  100 mg Intravenous Daily  . umeclidinium-vilanterol  1 puff Inhalation Daily  . vitamin C  250 mg Oral BID   Continuous Infusions: . sodium chloride     PRN Meds: chlordiazePOXIDE, nitroGLYCERIN, oxyCODONE   Vital Signs    Vitals:   04/30/18 2332 05/01/18 0409 05/01/18 0750 05/01/18 0850  BP: (!) 97/49 107/62  119/74  Pulse:    85  Resp: (!) 22 17  18   Temp: 98.9 F (37.2 C) 98.2 F (36.8 C)  100 F (37.8 C)  TempSrc: Oral Oral  Oral  SpO2:   98% 100%  Weight:  82.3 kg    Height:        Intake/Output Summary (Last 24 hours) at 05/01/2018 1011 Last data filed at 05/01/2018 0852 Gross per 24 hour  Intake 410 ml  Output 2 ml  Net 408 ml   Filed Weights   04/29/18 0018 04/29/18 0554 05/01/18 0409  Weight: 88.1 kg 86.7 kg 82.3 kg    Telemetry    NSR, no significant ventricular ectopy- Personally Reviewed  ECG    NSR without significant ST-T wave changes - Personally Reviewed  Physical Exam   GEN: No acute distress.   Neck: No JVD Cardiac: RRR, no murmurs, rubs, or gallops.  Respiratory: Clear to auscultation bilaterally. GI: Soft, nontender.  Distended. MS: No edema; No deformity. 3+ pitting edema Neuro:  Nonfocal  Psych: Normal affect   Labs    Chemistry Recent Labs  Lab 04/29/18 0132  04/29/18 1833 04/30/18 0209 05/01/18 0224  NA 124*   < > 131* 132* 132*  K 3.2*   < > 3.7 3.5 3.3*  CL 95*   < > 100 102 101  CO2 23   < > 21* 24 25  GLUCOSE 189*   < > 220* 131* 115*  BUN <5*   < > <5* <5* 5*  CREATININE 0.59*   < > 0.96 0.85 0.88  CALCIUM 7.7*   < > 7.8* 8.0* 7.9*  PROT 5.9*  --   --  5.3* 4.7*  ALBUMIN 2.4*  --   --  2.4* 2.2*  AST 73*  --   --  66* 55*  ALT 40  --   --  34 30  ALKPHOS 105  --   --  87 69  BILITOT 2.0*  --   --  3.2* 2.1*  GFRNONAA >60   < > >60 >60 >60  GFRAA >60   < > >60 >60 >60  ANIONGAP  6   < > 10 6 6    < > = values in this interval not displayed.     Hematology Recent Labs  Lab 04/29/18 1833 04/30/18 0209 05/01/18 0224  WBC 8.0 8.8 9.4  RBC 2.82* 2.87* 2.62*  HGB 8.7* 8.7* 7.7*  HCT 25.4* 25.9* 23.9*  MCV 90.1 90.2 91.2  MCH 30.9 30.3 29.4  MCHC 34.3 33.6 32.2  RDW 17.8* 17.9* 17.8*  PLT 280 283 244    Cardiac Enzymes Recent Labs  Lab 04/29/18 0132 04/29/18 0700 04/29/18 1833 04/30/18 0209  TROPONINI <0.03 0.09* <0.03 <0.03   No results for input(s): TROPIPOC in the last 168 hours.   BNPNo results for input(s): BNP, PROBNP in the last 168 hours.   DDimer No results for input(s): DDIMER in the last 168 hours.   Radiology    Ir Paracentesis  Result Date: 04/30/2018 INDICATION: Secondary to alcoholic cirrhosis. Request for diagnostic and therapeutic paracentesis. EXAM: ULTRASOUND GUIDED PARACENTESIS MEDICATIONS: 1% lidocaine 10 mL COMPLICATIONS: None immediate. PROCEDURE: Informed written consent was obtained from the patient after a discussion of the risks, benefits and alternatives to treatment. A timeout was performed prior to the initiation of the procedure. Initial ultrasound scanning demonstrates a large amount of ascites within the right lower abdominal  quadrant. The right lower abdomen was prepped and draped in the usual sterile fashion. 1% lidocaine with epinephrine was used for local anesthesia. Following this, a 19 gauge, 7-cm, Yueh catheter was introduced. An ultrasound image was saved for documentation purposes. The paracentesis was performed. The catheter was removed and a dressing was applied. The patient tolerated the procedure well without immediate post procedural complication. FINDINGS: A total of approximately 4.2 L of clear yellow fluid was removed. Samples were sent to the laboratory as requested by the clinical team. IMPRESSION: Successful ultrasound-guided paracentesis yielding 4.2 liters of peritoneal fluid. Read by: Gareth Eagle, PA-C Electronically Signed   By: Markus Daft M.D.   On: 04/30/2018 10:01    Cardiac Studies   Echo Study Conclusions  - Left ventricle: The cavity size was normal. Wall thickness was normal. Systolic function was normal. The estimated ejection fraction was in the range of 60% to 65%. Wall motion was normal; there were no regional wall motion abnormalities. Doppler parameters are consistent with abnormal left ventricular relaxation (grade 1 diastolic dysfunction). - Aortic valve: There was no stenosis. - Mitral valve: There was no significant regurgitation. - Right ventricle: The cavity size was normal. Systolic function was normal. - Pulmonary arteries: No complete TR doppler jet so unable to estimate PA systolic pressure. - Inferior vena cava: The vessel was normal in size. The respirophasic diameter changes were in the normal range (>= 50%), consistent with normal central venous pressure. - Pericardium, extracardiac: Ascites was present.  Impressions:  - Normal LV size with EF 60-65%. Normal RV size and systolic function. No significant valvular abnormalities. Ascites noted.  Patient Profile     52 y.o. male with cirrhosis(portal hypertension, ascites, esophageal  varices, hyponatremia),tension, COPD, [facility]osis [facility] chest pain,new ST changes in V1 to V3,and hyperacute T waves. Troponin elevation at 0.09 was thought to be secondary to demand ischemia fromGIblood loss(transfused1 unit)  Assessment & Plan    1. Chest pain  - not a candidate for PCI or CABG, planning for coronary CT if can get HR down  - continue nadolol. HR 80s, will give IV lopressor and get coronary CT. Patient is very irritated and wish  to leave the hospital.   2. Anasarca: related to severe hypoalbuminemia, albumin 2.2. On IV lasix, will likely D/C on 40mg  BID of lasix.   3. Blood loss anemia: s/p PRBC transfusion  4. AKI: improved  5. Alcoholic liver cirrhosis with increased ascites: s/p paracentesis 04/30/2018 with removal of 4.2L of clear yellow fluid. Abdomen still distended on exam     For questions or updates, please contact [place] Please consult [email or web address] for contact info under        Signed, Almyra Deforest, [place]  05/01/2018, 10:11 AM

## 2018-05-02 LAB — BASIC METABOLIC PANEL
Anion gap: 11 (ref 5–15)
BUN: 6 mg/dL (ref 6–20)
CHLORIDE: 97 mmol/L — AB (ref 98–111)
CO2: 24 mmol/L (ref 22–32)
Calcium: 8.1 mg/dL — ABNORMAL LOW (ref 8.9–10.3)
Creatinine, Ser: 0.73 mg/dL (ref 0.61–1.24)
GFR calc Af Amer: >60 mL/min (ref >60)
GFR calc non Af Amer: >60 mL/min (ref >60)
GLUCOSE: 135 mg/dL — AB (ref 70–99)
POTASSIUM: 3.3 mmol/L — AB (ref 3.5–5.1)
Sodium: 132 mmol/L — ABNORMAL LOW (ref 135–145)

## 2018-05-02 LAB — BPAM RBC
BLOOD PRODUCT EXPIRATION DATE: 201911182359
Blood Product Expiration Date: 201911182359
ISSUE DATE / TIME: 201911111041
ISSUE DATE / TIME: 201911131225
UNIT TYPE AND RH: 6200
UNIT TYPE AND RH: 6200

## 2018-05-02 LAB — CBC
HEMATOCRIT: 30 % — AB (ref 39.0–52.0)
Hemoglobin: 10.1 g/dL — ABNORMAL LOW (ref 13.0–17.0)
MCH: 30.5 pg (ref 26.0–34.0)
MCHC: 33.7 g/dL (ref 30.0–36.0)
MCV: 90.6 fL (ref 80.0–100.0)
NRBC: 0 % (ref 0.0–0.2)
Platelets: 255 10*3/uL (ref 150–400)
RBC: 3.31 MIL/uL — AB (ref 4.22–5.81)
RDW: 17.3 % — ABNORMAL HIGH (ref 11.5–15.5)
WBC: 9.8 10*3/uL (ref 4.0–10.5)

## 2018-05-02 LAB — TYPE AND SCREEN
ABO/RH(D): A POS
Antibody Screen: NEGATIVE
UNIT DIVISION: 0
Unit division: 0

## 2018-05-02 MED ORDER — POTASSIUM CHLORIDE ER 10 MEQ PO TBCR: 10.0000 meq | EXTENDED_RELEASE_TABLET | Freq: Every day | ORAL | 0 refills | Status: DC

## 2018-05-02 MED ORDER — NICOTINE 21 MG/24HR TD PT24
21.0000 mg | MEDICATED_PATCH | Freq: Every day | TRANSDERMAL | Status: DC
  Administered 2018-05-02: 21 mg via TRANSDERMAL
  Filled 2018-05-02: qty 1

## 2018-05-02 MED ORDER — SPIRONOLACTONE 100 MG PO TABS: 100.0000 mg | ORAL_TABLET | Freq: Every day | ORAL | 0 refills | Status: DC

## 2018-05-02 MED ORDER — FUROSEMIDE 80 MG PO TABS: 80.0000 mg | ORAL_TABLET | Freq: Every day | ORAL | 0 refills | Status: DC

## 2018-05-02 MED ORDER — THIAMINE HCL 100 MG PO TABS: 100.0000 mg | ORAL_TABLET | Freq: Every day | ORAL | 0 refills | Status: DC

## 2018-05-02 MED ORDER — LACTULOSE 10 GM/15ML PO SOLN: 30.0000 g | Freq: Three times a day (TID) | ORAL | 2 refills | Status: DC

## 2018-05-02 MED ORDER — PANTOPRAZOLE SODIUM 20 MG PO TBEC: 20.0000 mg | DELAYED_RELEASE_TABLET | Freq: Two times a day (BID) | ORAL | 2 refills | Status: DC

## 2018-05-02 MED ORDER — POTASSIUM CHLORIDE CRYS ER 20 MEQ PO TBCR
30.0000 meq | EXTENDED_RELEASE_TABLET | ORAL | Status: DC
  Administered 2018-05-02: 30 meq via ORAL
  Filled 2018-05-02: qty 1

## 2018-05-02 NOTE — Discharge Summary (Signed)
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 at 3:37 PM  [facility]   Office  [phone]

## 2018-05-02 NOTE — Progress Notes (Signed)
Pt signed AMA papers. Telemetry removed. IV's removed. MD made aware. MD and RN at bedside educating patient and ex wife. Prescriptions handed to patient.    Lucius Conn, RN

## 2018-05-02 NOTE — Discharge Instructions (Signed)
Follow with Primary MD [name], MD in 7 days   Get CBC, CMP,  checked  by Primary MD next visit.    Activity: As tolerated with Full fall precautions use walker/cane & assistance as needed   Disposition Home   Diet: Heart Healthy , low salt,, with feeding assistance and aspiration precautions.  For Heart failure patients - Check your Weight same time everyday, if you gain over 2 pounds, or you develop in leg swelling, experience more shortness of breath or chest pain, call your Primary MD immediately. Follow Cardiac Low Salt Diet and 1.5 lit/day fluid restriction.   On your next visit with your primary care physician please Get Medicines reviewed and adjusted.   Please request your Prim.MD to go over all Hospital Tests and Procedure/Radiological results at the follow up, please get all Hospital records sent to your Prim MD by signing hospital release before you go home.   If you experience worsening of your admission symptoms, develop shortness of breath, life threatening emergency, suicidal or homicidal thoughts you must seek medical attention immediately by calling 911 or calling your MD immediately  if symptoms less severe.  You Must read complete instructions/literature along with all the possible adverse reactions/side effects for all the Medicines you take and that have been prescribed to you. Take any new Medicines after you have completely understood and accpet all the possible adverse reactions/side effects.   Do not drive, operating heavy machinery, perform activities at heights, swimming or participation in water activities or provide baby sitting services if your were admitted for syncope or siezures until you have seen by Primary MD or a Neurologist and advised to do so again.  Do not drive when taking Pain medications.    Do not take more than prescribed Pain, Sleep and Anxiety Medications  Special Instructions: If you have smoked or chewed Tobacco  in the last 2 yrs  please stop smoking, stop any regular Alcohol  and or any Recreational drug use.  Wear Seat belts while driving.   Please note  You were cared for by a hospitalist during your hospital stay. If you have any questions about your discharge medications or the care you received while you were in the hospital after you are discharged, you can call the unit and asked to speak with the hospitalist on call if the hospitalist that took care of you is not available. Once you are discharged, your primary care physician will handle any further medical issues. Please note that NO REFILLS for any discharge medications will be authorized once you are discharged, as it is imperative that you return to your primary care physician (or establish a relationship with a primary care physician if you do not have one) for your aftercare needs so that they can reassess your need for medications and monitor your lab values.

## 2018-05-02 NOTE — Evaluation (Signed)
Physical Therapy Evaluation Patient Details Name: Arthur Johnson MRN: [id number] DOB: June 23, 1965 Today's Date: 05/02/2018   History of Present Illness  Pt is a 52 y.o. M with significant PMH of cirrhosis with portal hypertension, ascites, esophageal varices, hyponatremia, COPD, diverticulosis, GERD, hypertesnion, prior hospitalization for seizure, alcohol abuse, severe protein calorie malnutrition, who presents with abnormal EKG concerning for ischemia and noted to be decompensated cirrhosis. Work up per cardiology, but patient is poor candidate for intervention as high risk for recurrent GI bleed. Plan for CTA today.   Clinical Impression  Pt admitted with above diagnosis. Pt currently with functional limitations due to the deficits listed below (see PT Problem List). On PT evaluation, patient ambulating 250 feet with no assistive device and supervision. HR 91-109 bpm, BP 140/71 post mobility. Presenting with mild balance deficits, chronic back pain, and BLE edema. Pt will benefit from skilled PT to increase their independence and safety with mobility to allow discharge to the venue listed below.       Follow Up Recommendations No PT follow up;Supervision - Intermittent    Equipment Recommendations  None recommended by PT    Recommendations for Other Services       Precautions / Restrictions Precautions Precautions: Other (comment) Precaution Comments: watch HR Restrictions Weight Bearing Restrictions: No      Mobility  Bed Mobility               General bed mobility comments: Sitting EOB on arrival  Transfers Overall transfer level: Independent Equipment used: None                Ambulation/Gait Ambulation/Gait assistance: Supervision Gait Distance (Feet): 250 Feet Assistive device: None Gait Pattern/deviations: Wide base of support;Decreased dorsiflexion - right;Decreased dorsiflexion - left;Step-through pattern     General Gait Details: Patient  utilizing wide BOS and decreased heel strike at initial contact secondary to increased BLE edema  Stairs            Wheelchair Mobility    Modified Rankin (Stroke Patients Only)       Balance Overall balance assessment: Mild deficits observed, not formally tested                                           Pertinent Vitals/Pain Pain Assessment: Faces Faces Pain Scale: Hurts a little bit Pain Location: chronic back pain Pain Descriptors / Indicators: Aching Pain Intervention(s): Monitored during session    Home Living Family/patient expects to be discharged to:: Private residence Living Arrangements: Alone Available Help at Discharge: Family;Available PRN/intermittently(daughter, ex wife) Type of Home: Apartment Home Access: Level entry     Home Layout: One level Home Equipment: Cane - single point      Prior Function Level of Independence: Independent         Comments: On disability     Hand Dominance        Extremity/Trunk Assessment   Upper Extremity Assessment Upper Extremity Assessment: Overall WFL for tasks assessed    Lower Extremity Assessment Lower Extremity Assessment: RLE deficits/detail;LLE deficits/detail RLE Sensation: decreased light touch LLE Sensation: decreased light touch       Communication   Communication: No difficulties  Cognition Arousal/Alertness: Awake/alert Behavior During Therapy: WFL for tasks assessed/performed Overall Cognitive Status: Within Functional Limits for tasks assessed  General Comments      Exercises     Assessment/Plan    PT Assessment Patient needs continued PT services  PT Problem List Decreased strength;Decreased activity tolerance;Decreased balance;Decreased mobility;Pain;Cardiopulmonary status limiting activity       PT Treatment Interventions DME instruction;Gait training;Stair training;Functional mobility  training;Therapeutic activities;Therapeutic exercise;Balance training;Patient/family education    PT Goals (Current goals can be found in the Care Plan section)  Acute Rehab PT Goals Patient Stated Goal: "get my feet in my shoes." PT Goal Formulation: With patient Time For Goal Achievement: 05/16/18 Potential to Achieve Goals: Good    Frequency Min 3X/week   Barriers to discharge        Co-evaluation               AM-PAC PT "6 Clicks" Daily Activity  Outcome Measure Difficulty turning over in bed (including adjusting bedclothes, sheets and blankets)?: None Difficulty moving from lying on back to sitting on the side of the bed? : None Difficulty sitting down on and standing up from a chair with arms (e.g., wheelchair, bedside commode, etc,.)?: None Help needed moving to and from a bed to chair (including a wheelchair)?: None Help needed walking in hospital room?: A Little Help needed climbing 3-5 steps with a railing? : A Little 6 Click Score: 22    End of Session   Activity Tolerance: Patient tolerated treatment well Patient left: Other (comment);with call bell/phone within reach(seated EOB)   PT Visit Diagnosis: Unsteadiness on feet (R26.81);Difficulty in walking, not elsewhere classified (R26.2)    Time: 4540-9811 PT Time Calculation (min) (ACUTE ONLY): 14 min   Charges:   PT Evaluation $PT Eval Moderate Complexity: 1 Mod         Ellamae Sia, [place], DPT Acute Rehabilitation Services Pager [phone] Office [phone]   Willy Eddy 05/02/2018, 8:37 AM

## 2018-05-02 NOTE — Progress Notes (Signed)
Patient agitated and threatening to leave AMA. MD paged and Nicotine patch ordered. Pt placed phone call to ex-wife for ride home. MD made aware. MD to bedside.  Will continue to monitor.  Lucius Conn, RN

## 2018-05-07 LAB — CULTURE, BODY FLUID-BOTTLE: CULTURE: NO GROWTH

## 2018-05-07 LAB — CULTURE, BODY FLUID W GRAM STAIN -BOTTLE

## 2018-05-14 ENCOUNTER — Encounter (INDEPENDENT_AMBULATORY_CARE_PROVIDER_SITE_OTHER): Payer: Self-pay | Admitting: Internal Medicine

## 2018-05-14 ENCOUNTER — Ambulatory Visit (INDEPENDENT_AMBULATORY_CARE_PROVIDER_SITE_OTHER): Payer: Medicare Other | Admitting: Internal Medicine

## 2018-05-14 VITALS — BP 90/60 | HR 76 | Temp 98.8°F | Ht 68.0 in | Wt 176.4 lb

## 2018-05-14 DIAGNOSIS — K7682 Hepatic encephalopathy: Secondary | ICD-10-CM

## 2018-05-14 DIAGNOSIS — K7290 Hepatic failure, unspecified without coma: Secondary | ICD-10-CM

## 2018-05-14 DIAGNOSIS — D649 Anemia, unspecified: Secondary | ICD-10-CM

## 2018-05-14 DIAGNOSIS — K7031 Alcoholic cirrhosis of liver with ascites: Secondary | ICD-10-CM | POA: Diagnosis not present

## 2018-05-14 DIAGNOSIS — K709 Alcoholic liver disease, unspecified: Secondary | ICD-10-CM | POA: Diagnosis not present

## 2018-05-14 MED ORDER — SPIRONOLACTONE 100 MG PO TABS: 200.0000 mg | ORAL_TABLET | Freq: Every day | ORAL | 0 refills | Status: DC

## 2018-05-14 MED ORDER — SPIRONOLACTONE 100 MG PO TABS: 100.0000 mg | ORAL_TABLET | Freq: Every day | ORAL | 0 refills | Status: DC

## 2018-05-14 NOTE — Patient Instructions (Signed)
Daily weights. Call if you have lost more than 8 pounds in which case will adjust diuretic medications.

## 2018-05-14 NOTE — Progress Notes (Signed)
Presenting complaint;  Follow-up for decompensated alcoholic liver disease.  Database and subjective:  Arthur Johnson is 52 year old Caucasian male who was diagnosed with alcoholic cirrhosis when he was admitted to [facility] 1 year ago.  He also had GI bleed and underwent EGD colonoscopy and small bowel given capsule study.  It was felt he may have bled from ileal diverticula or small bowel varices.  He did fine for several months after cutting back on alcohol intake drastically.  His albumin was 3.8 in July this year.   In the last 2 months he has been admitted 3 times twice to any pain in the toe time to [facility].  He was admitted for GI bleed and hepatic encephalopathy.  And on his third admission to [facility] he was also found to have non-STEMI.  Arthur Johnson does not agree with this diagnosis.  He is here for scheduled visit accompanied by his ex-wife Arthur Johnson who is been assisting in his management along with her daughter. Arthur Johnson was discharged from [facility] on 05/02/2018.  His last abdominal tap was on 04/30/2018. He says fluid is slowly coming back.  He does not feel it is bad enough for him to undergo a tap.  He says he has lost 8 to 10 pounds since he left the hospital.  He states he has not had any alcohol in about 2 months.  He says his appetite is good.  He is having 4-6 bowel movements per day.  He remains on lactulose 3 times a day.  He denies heartburn dysphagia nausea vomiting melena or rectal bleeding.  He states leg edema has decreased somewhat. According to [facility] he has not been confused.   Current Medications: Outpatient Encounter Medications as of 05/14/2018  Medication Sig  . ANORO ELLIPTA 62.5-25 MCG/INH AEPB Inhale 1 puff into the lungs daily.  . ferrous sulfate (FERROUSUL) 325 (65 FE) MG tablet Take 1 tablet (325 mg total) by mouth 2 (two) times daily with a meal. Take with Vitamin C  . folic acid (FOLVITE) 1 MG tablet Take 1 tablet (1 mg total) by mouth daily.  . furosemide  (LASIX) 80 MG tablet Take 1 tablet (80 mg total) by mouth daily.  Marland Kitchen lactulose (CHRONULAC) 10 GM/15ML solution Take 45 mLs (30 g total) by mouth 3 (three) times daily.  . Multiple Vitamin (MULTIVITAMIN WITH MINERALS) TABS tablet Take 1 tablet by mouth daily.  . nadolol (CORGARD) 20 MG tablet Take 1 tablet (20 mg total) by mouth daily.  . ondansetron (ZOFRAN) 4 MG tablet Take 1 tablet (4 mg total) by mouth every 6 (six) hours as needed for nausea. (Arthur Johnson taking differently: Take 4 mg by mouth every 8 (eight) hours as needed for nausea. )  . pantoprazole (PROTONIX) 20 MG tablet Take 1 tablet (20 mg total) by mouth 2 (two) times daily.  . potassium chloride (K-DUR) 10 MEQ tablet Take 1 tablet (10 mEq total) by mouth daily.  Marland Kitchen spironolactone (ALDACTONE) 100 MG tablet Take 1 tablet (100 mg total) by mouth daily.  Marland Kitchen thiamine 100 MG tablet Take 1 tablet (100 mg total) by mouth daily.  . vitamin C (ASCORBIC ACID) 250 MG tablet Take 1 tablet (250 mg total) by mouth 2 (two) times daily. Take with ferrous sulfate (iron) (Arthur Johnson taking differently: Take 250 mg by mouth daily. )   No facility-administered encounter medications on file as of 05/14/2018.      Objective: Blood pressure 90/60, pulse 76, temperature 98.8 F (37.1 C), temperature source Oral,  height _0  (1.727 m), weight 176 lb 6.4 oz (80 kg). Arthur Johnson is alert and in no acute distress. He does not have asterixis. Conjunctiva is pink. Sclera is nonicteric Oropharyngeal mucosa is normal. No neck masses or thyromegaly noted. Cardiac exam with regular rhythm normal S1 and S2. No murmur or gallop noted. Lungs are clear to auscultation. Abdomen is distended but not tense or tender.  Flanks are dull.  Liver and spleen cannot be balloted. He has 2-3+ pitting edema involving both legs up to the level of knees.  Labs/studies Results:  CBC Latest Ref Rng & Units 05/02/2018 05/01/2018 04/30/2018  WBC 4.0 - 10.5 K/uL 9.8 9.4 8.8  Hemoglobin 13.0  - 17.0 g/dL 10.1(L) 7.7(L) 8.7(L)  Hematocrit 39.0 - 52.0 % 30.0(L) 23.9(L) 25.9(L)  Platelets 150 - 400 K/uL 255 244 283    CMP Latest Ref Rng & Units 05/02/2018 05/01/2018 04/30/2018  Glucose 70 - 99 mg/dL 135(H) 115(H) 131(H)  BUN 6 - 20 mg/dL 6 5(L) <5(L)  Creatinine 0.61 - 1.24 mg/dL 0.73 0.88 0.85  Sodium 135 - 145 mmol/L 132(L) 132(L) 132(L)  Potassium 3.5 - 5.1 mmol/L 3.3(L) 3.3(L) 3.5  Chloride 98 - 111 mmol/L 97(L) 101 102  CO2 22 - 32 mmol/L _1 Calcium 8.9 - 10.3 mg/dL 8.1(L) 7.9(L) 8.0(L)  Total Protein 6.5 - 8.1 g/dL - 4.7(L) 5.3(L)  Total Bilirubin 0.3 - 1.2 mg/dL - 2.1(H) 3.2(H)  Alkaline Phos 38 - 126 U/L - 69 87  AST 15 - 41 U/L - 55(H) 66(H)  ALT 0 - 44 U/L - 30 34    Hepatic Function Latest Ref Rng & Units 05/01/2018 04/30/2018 04/29/2018  Total Protein 6.5 - 8.1 g/dL 4.7(L) 5.3(L) 5.9(L)  Albumin 3.5 - 5.0 g/dL 2.2(L) 2.4(L) 2.4(L)  AST 15 - 41 U/L 55(H) 66(H) 73(H)  ALT 0 - 44 U/L 30 34 40  Alk Phosphatase 38 - 126 U/L 69 87 105  Total Bilirubin 0.3 - 1.2 mg/dL 2.1(H) 3.2(H) 2.0(H)  Bilirubin, Direct 0.0 - 0.2 mg/dL 1.0(H) 1.3(H) -       Assessment:  #1.  Alcoholic cirrhosis.  He has decompensated disease as follows.  #2.  Ascites.  He underwent LVAP on 04/30/2018 while he was hospitalized.  Ascites is significant but not tense.  He does not need to undergo abdominal tap at this time.  #3.  Hepatic encephalopathy.  Clinically he is doing well with 3 times daily lactulose.  #4.  Anemia.  He has chronic anemia as well as history of GI bleed.  He was recently evaluated while at [facility] and found to have nonbleeding esophageal varices and portal gastropathy.  Last year he had full work-up including EGD and colonoscopy as well as small bowel given capsule study.  It was felt either blood from small bowel varices or ileal diverticula.   Plan:  Discontinue KCl. Continue furosemide at 80 mg p.o. Daily. Increase Spironolactone to 200 mg p.o. Daily. Daily  weights.  Arthur Johnson to call office if his weight drops by more than 8 pounds. Arthur Johnson will go to the lab on 05/20/2018 for CBC, LFTs, metabolic 7 and INR. Office visit in 1 month.

## 2018-05-20 ENCOUNTER — Ambulatory Visit (INDEPENDENT_AMBULATORY_CARE_PROVIDER_SITE_OTHER): Payer: Medicare Other | Admitting: Internal Medicine

## 2018-05-21 LAB — CBC
HCT: 33.2 % — ABNORMAL LOW (ref 38.5–50.0)
HEMOGLOBIN: 11.2 g/dL — AB (ref 13.2–17.1)
MCH: 30.1 pg (ref 27.0–33.0)
MCHC: 33.7 g/dL (ref 32.0–36.0)
MCV: 89.2 fL (ref 80.0–100.0)
MPV: 10.5 fL (ref 7.5–12.5)
PLATELETS: 262 10*3/uL (ref 140–400)
RBC: 3.72 10*6/uL — ABNORMAL LOW (ref 4.20–5.80)
RDW: 15.9 % — ABNORMAL HIGH (ref 11.0–15.0)
WBC: 6.6 10*3/uL (ref 3.8–10.8)

## 2018-05-21 LAB — HEPATIC FUNCTION PANEL
AG Ratio: 0.9 (calc) — ABNORMAL LOW (ref 1.0–2.5)
ALBUMIN MSPROF: 3.1 g/dL — AB (ref 3.6–5.1)
ALKALINE PHOSPHATASE (APISO): 83 U/L (ref 40–115)
ALT: 17 U/L (ref 9–46)
AST: 41 U/L — AB (ref 10–35)
Bilirubin, Direct: 0.8 mg/dL — ABNORMAL HIGH (ref 0.0–0.2)
Globulin: 3.4 g/dL (calc) (ref 1.9–3.7)
Indirect Bilirubin: 1.1 mg/dL (calc) (ref 0.2–1.2)
Total Bilirubin: 1.9 mg/dL — ABNORMAL HIGH (ref 0.2–1.2)
Total Protein: 6.5 g/dL (ref 6.1–8.1)

## 2018-05-21 LAB — BASIC METABOLIC PANEL
BUN / CREAT RATIO: 9 (calc) (ref 6–22)
BUN: 5 mg/dL — ABNORMAL LOW (ref 7–25)
CO2: 29 mmol/L (ref 20–32)
CREATININE: 0.56 mg/dL — AB (ref 0.70–1.33)
Calcium: 8.8 mg/dL (ref 8.6–10.3)
Chloride: 97 mmol/L — ABNORMAL LOW (ref 98–110)
GLUCOSE: 124 mg/dL (ref 65–139)
Potassium: 3.8 mmol/L (ref 3.5–5.3)
Sodium: 134 mmol/L — ABNORMAL LOW (ref 135–146)

## 2018-05-21 LAB — PROTIME-INR
INR: 1.2 — AB
Prothrombin Time: 12.3 s — ABNORMAL HIGH (ref 9.0–11.5)

## 2018-05-22 ENCOUNTER — Other Ambulatory Visit (INDEPENDENT_AMBULATORY_CARE_PROVIDER_SITE_OTHER): Payer: Self-pay | Admitting: Internal Medicine

## 2018-06-04 ENCOUNTER — Ambulatory Visit (INDEPENDENT_AMBULATORY_CARE_PROVIDER_SITE_OTHER): Payer: Medicare Other | Admitting: Internal Medicine

## 2018-06-04 ENCOUNTER — Encounter (INDEPENDENT_AMBULATORY_CARE_PROVIDER_SITE_OTHER): Payer: Self-pay | Admitting: Internal Medicine

## 2018-06-17 ENCOUNTER — Other Ambulatory Visit (INDEPENDENT_AMBULATORY_CARE_PROVIDER_SITE_OTHER): Payer: Self-pay | Admitting: Internal Medicine

## 2018-06-18 ENCOUNTER — Other Ambulatory Visit (INDEPENDENT_AMBULATORY_CARE_PROVIDER_SITE_OTHER): Payer: Self-pay | Admitting: *Deleted

## 2018-06-18 DIAGNOSIS — K7031 Alcoholic cirrhosis of liver with ascites: Secondary | ICD-10-CM

## 2018-06-18 MED ORDER — FUROSEMIDE 40 MG PO TABS: 40.0000 mg | ORAL_TABLET | Freq: Every day | ORAL | 5 refills | Status: DC

## 2018-06-25 ENCOUNTER — Encounter (INDEPENDENT_AMBULATORY_CARE_PROVIDER_SITE_OTHER): Payer: Self-pay | Admitting: Internal Medicine

## 2018-06-25 ENCOUNTER — Ambulatory Visit (INDEPENDENT_AMBULATORY_CARE_PROVIDER_SITE_OTHER): Payer: Medicare Other | Admitting: Internal Medicine

## 2018-08-20 ENCOUNTER — Other Ambulatory Visit (INDEPENDENT_AMBULATORY_CARE_PROVIDER_SITE_OTHER): Payer: Self-pay | Admitting: Internal Medicine

## 2018-08-30 ENCOUNTER — Other Ambulatory Visit (INDEPENDENT_AMBULATORY_CARE_PROVIDER_SITE_OTHER): Payer: Self-pay | Admitting: Internal Medicine

## 2018-10-29 ENCOUNTER — Other Ambulatory Visit (INDEPENDENT_AMBULATORY_CARE_PROVIDER_SITE_OTHER): Payer: Self-pay | Admitting: Internal Medicine

## 2018-11-25 ENCOUNTER — Encounter (HOSPITAL_COMMUNITY): Payer: Self-pay | Admitting: Emergency Medicine

## 2018-11-25 ENCOUNTER — Emergency Department (HOSPITAL_COMMUNITY)
Admission: EM | Admit: 2018-11-25 | Discharge: 2018-11-25 | Disposition: A | Discharge status: Home / Self Care | Payer: Medicare Other | Attending: Emergency Medicine | Admitting: Emergency Medicine

## 2018-11-25 ENCOUNTER — Other Ambulatory Visit: Payer: Self-pay

## 2018-11-25 DIAGNOSIS — J449 Chronic obstructive pulmonary disease, unspecified: Secondary | ICD-10-CM | POA: Insufficient documentation

## 2018-11-25 DIAGNOSIS — Z98818 Other dental procedure status: Secondary | ICD-10-CM | POA: Diagnosis present

## 2018-11-25 DIAGNOSIS — F17210 Nicotine dependence, cigarettes, uncomplicated: Secondary | ICD-10-CM | POA: Diagnosis not present

## 2018-11-25 DIAGNOSIS — Z79899 Other long term (current) drug therapy: Secondary | ICD-10-CM | POA: Diagnosis not present

## 2018-11-25 DIAGNOSIS — K08409 Partial loss of teeth, unspecified cause, unspecified class: Secondary | ICD-10-CM

## 2018-11-25 DIAGNOSIS — I10 Essential (primary) hypertension: Secondary | ICD-10-CM | POA: Diagnosis not present

## 2018-11-25 DIAGNOSIS — R58 Hemorrhage, not elsewhere classified: Secondary | ICD-10-CM

## 2018-11-25 NOTE — ED Notes (Signed)
Pt wheeled to waiting room. Pt verbalized understanding of discharge instructions.   

## 2018-11-25 NOTE — Discharge Instructions (Addendum)
Do not sit back.  Call your dentist in the morning.

## 2018-11-25 NOTE — ED Triage Notes (Signed)
Pt stating he had 7 teeth removed this morning. Pt bleeding from top of mouth. Pt did not call dentist.
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
## 2019-02-10 ENCOUNTER — Other Ambulatory Visit (INDEPENDENT_AMBULATORY_CARE_PROVIDER_SITE_OTHER): Payer: Self-pay | Admitting: Internal Medicine

## 2019-03-17 ENCOUNTER — Other Ambulatory Visit (HOSPITAL_COMMUNITY): Payer: Self-pay | Admitting: Neurology

## 2019-03-17 DIAGNOSIS — M546 Pain in thoracic spine: Secondary | ICD-10-CM

## 2019-03-17 DIAGNOSIS — M542 Cervicalgia: Secondary | ICD-10-CM

## 2019-03-28 ENCOUNTER — Other Ambulatory Visit: Payer: Self-pay

## 2019-03-28 ENCOUNTER — Encounter (HOSPITAL_COMMUNITY): Payer: Self-pay

## 2019-03-28 ENCOUNTER — Emergency Department (HOSPITAL_COMMUNITY)
Admission: EM | Admit: 2019-03-28 | Discharge: 2019-03-28 | Disposition: A | Discharge status: Home / Self Care | Payer: Medicare Other | Attending: Emergency Medicine | Admitting: Emergency Medicine

## 2019-03-28 DIAGNOSIS — J449 Chronic obstructive pulmonary disease, unspecified: Secondary | ICD-10-CM | POA: Insufficient documentation

## 2019-03-28 DIAGNOSIS — K0889 Other specified disorders of teeth and supporting structures: Secondary | ICD-10-CM

## 2019-03-28 DIAGNOSIS — F17210 Nicotine dependence, cigarettes, uncomplicated: Secondary | ICD-10-CM | POA: Insufficient documentation

## 2019-03-28 DIAGNOSIS — I10 Essential (primary) hypertension: Secondary | ICD-10-CM | POA: Insufficient documentation

## 2019-03-28 MED ORDER — MAGIC MOUTHWASH W/LIDOCAINE: 5.0000 mL | Freq: Three times a day (TID) | ORAL | 0 refills | Status: DC | PRN

## 2019-03-28 MED ORDER — PENICILLIN V POTASSIUM 500 MG PO TABS: 500.0000 mg | ORAL_TABLET | Freq: Three times a day (TID) | ORAL | 0 refills | Status: DC
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
## 2019-03-28 NOTE — ED Triage Notes (Signed)
Pt has a broken left back tooth. States he doesn't remember when it broke. Pt has a history of COPD. O2 sats 91% on RA.

## 2019-03-28 NOTE — Discharge Instructions (Addendum)
Take the antibiotic as directed until it is finished.  Use the mouthwash as directed to help with pain.  Follow-up with your dentist soon

## 2019-03-29 ENCOUNTER — Other Ambulatory Visit: Payer: Self-pay

## 2019-03-29 ENCOUNTER — Emergency Department (HOSPITAL_COMMUNITY)
Admission: EM | Admit: 2019-03-29 | Discharge: 2019-03-29 | Disposition: A | Discharge status: Home / Self Care | Payer: Medicare Other | Attending: Emergency Medicine | Admitting: Emergency Medicine

## 2019-03-29 ENCOUNTER — Encounter (HOSPITAL_COMMUNITY): Payer: Self-pay | Admitting: Emergency Medicine

## 2019-03-29 ENCOUNTER — Emergency Department (HOSPITAL_COMMUNITY): Payer: Medicare Other

## 2019-03-29 DIAGNOSIS — Z79899 Other long term (current) drug therapy: Secondary | ICD-10-CM | POA: Insufficient documentation

## 2019-03-29 DIAGNOSIS — J449 Chronic obstructive pulmonary disease, unspecified: Secondary | ICD-10-CM | POA: Insufficient documentation

## 2019-03-29 DIAGNOSIS — I252 Old myocardial infarction: Secondary | ICD-10-CM | POA: Insufficient documentation

## 2019-03-29 DIAGNOSIS — I10 Essential (primary) hypertension: Secondary | ICD-10-CM | POA: Insufficient documentation

## 2019-03-29 DIAGNOSIS — F17210 Nicotine dependence, cigarettes, uncomplicated: Secondary | ICD-10-CM | POA: Insufficient documentation

## 2019-03-29 DIAGNOSIS — Z20828 Contact with and (suspected) exposure to other viral communicable diseases: Secondary | ICD-10-CM | POA: Diagnosis not present

## 2019-03-29 DIAGNOSIS — J189 Pneumonia, unspecified organism: Secondary | ICD-10-CM | POA: Insufficient documentation

## 2019-03-29 DIAGNOSIS — R0602 Shortness of breath: Secondary | ICD-10-CM | POA: Diagnosis present

## 2019-03-29 LAB — BASIC METABOLIC PANEL
Anion gap: 10 (ref 5–15)
BUN: 11 mg/dL (ref 6–20)
CO2: 28 mmol/L (ref 22–32)
Calcium: 8.3 mg/dL — ABNORMAL LOW (ref 8.9–10.3)
Chloride: 90 mmol/L — ABNORMAL LOW (ref 98–111)
Creatinine, Ser: 0.49 mg/dL — ABNORMAL LOW (ref 0.61–1.24)
GFR calc Af Amer: >60 mL/min (ref >60)
GFR calc non Af Amer: >60 mL/min (ref >60)
Glucose, Bld: 173 mg/dL — ABNORMAL HIGH (ref 70–99)
Potassium: 2.8 mmol/L — ABNORMAL LOW (ref 3.5–5.1)
Sodium: 128 mmol/L — ABNORMAL LOW (ref 135–145)

## 2019-03-29 LAB — CBC WITH DIFFERENTIAL/PLATELET
Abs Immature Granulocytes: 0.06 10*3/uL (ref 0.00–0.07)
Basophils Absolute: 0 10*3/uL (ref 0.0–0.1)
Basophils Relative: 0 %
Eosinophils Absolute: 0 10*3/uL (ref 0.0–0.5)
Eosinophils Relative: 0 %
HCT: 37 % — ABNORMAL LOW (ref 39.0–52.0)
Hemoglobin: 12.7 g/dL — ABNORMAL LOW (ref 13.0–17.0)
Immature Granulocytes: 1 %
Lymphocytes Relative: 14 %
Lymphs Abs: 1.3 10*3/uL (ref 0.7–4.0)
MCH: 31.9 pg (ref 26.0–34.0)
MCHC: 34.3 g/dL (ref 30.0–36.0)
MCV: 93 fL (ref 80.0–100.0)
Monocytes Absolute: 0.8 10*3/uL (ref 0.1–1.0)
Monocytes Relative: 9 %
Neutro Abs: 7.1 10*3/uL (ref 1.7–7.7)
Neutrophils Relative %: 76 %
Platelets: 70 10*3/uL — ABNORMAL LOW (ref 150–400)
RBC: 3.98 MIL/uL — ABNORMAL LOW (ref 4.22–5.81)
RDW: 16.1 % — ABNORMAL HIGH (ref 11.5–15.5)
WBC: 9.2 10*3/uL (ref 4.0–10.5)
nRBC: 0 % (ref 0.0–0.2)

## 2019-03-29 LAB — LACTIC ACID, PLASMA: Lactic Acid, Venous: 1.5 mmol/L (ref 0.5–1.9)

## 2019-03-29 LAB — SARS CORONAVIRUS 2 BY RT PCR (HOSPITAL ORDER, PERFORMED IN ~~LOC~~ HOSPITAL LAB): SARS Coronavirus 2: NEGATIVE

## 2019-03-29 MED ORDER — SODIUM CHLORIDE 0.9 % IV SOLN
1.0000 g | Freq: Once | INTRAVENOUS | Status: AC
  Administered 2019-03-29: 1 g via INTRAVENOUS
  Filled 2019-03-29: qty 10

## 2019-03-29 MED ORDER — AZITHROMYCIN 250 MG PO TABS
1000.0000 mg | ORAL_TABLET | Freq: Once | ORAL | Status: AC
  Administered 2019-03-29: 1000 mg via ORAL
  Filled 2019-03-29: qty 4

## 2019-03-29 MED ORDER — ALBUTEROL SULFATE HFA 108 (90 BASE) MCG/ACT IN AERS
4.0000 | INHALATION_SPRAY | Freq: Once | RESPIRATORY_TRACT | Status: AC
  Administered 2019-03-29: 4 via RESPIRATORY_TRACT
  Filled 2019-03-29: qty 6.7

## 2019-03-29 MED ORDER — LEVOFLOXACIN 500 MG PO TABS: 500.0000 mg | ORAL_TABLET | Freq: Every day | ORAL | 0 refills | Status: DC

## 2019-03-29 NOTE — Discharge Instructions (Addendum)
1-2 puffs of the albuterol inhaler every 4-6 hrs.  It's important that you take the antibiotic prescribed today.  Keep your doctor appt for this week.  Return to ER if you developing increasing shortness of breath or chest pain

## 2019-03-29 NOTE — ED Notes (Addendum)
Sats are 85% on RA @ rest.  Placed back on O2@2L /M.  Pt stated, "I am not staying  Here, I don't care was that machine says.  I had two friends that had "walking pneumonia" and they went home and did fine."
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
## 2019-03-29 NOTE — ED Notes (Signed)
Pts Sats 90-91 on #L/M via n/c at rest.

## 2019-03-29 NOTE — ED Triage Notes (Signed)
Pt called EMS this morning due to generalized weakness, fatiuge, cough, SHOB. Hx of COPD, o2 sat 91 on RA per EMS. Pt alert and oriented.

## 2019-04-03 LAB — CULTURE, BLOOD (ROUTINE X 2)
Culture: NO GROWTH
Culture: NO GROWTH
Special Requests: ADEQUATE
Special Requests: ADEQUATE

## 2019-04-04 ENCOUNTER — Telehealth (INDEPENDENT_AMBULATORY_CARE_PROVIDER_SITE_OTHER): Payer: Self-pay | Admitting: *Deleted

## 2019-04-04 NOTE — Telephone Encounter (Signed)
Talked with Dr.Rehman. He is okay with the patient taking the Gabapentin , but this medication will not help  Him with his drinking If the patient feels that he is ready to stop then he should pursue this. We will call in Librium 25 mg - patient will take by mouth three times daily prn for 2 weeks #42 NO REFILLS. This has been called to [facility].  Patient called and made aware.

## 2019-04-04 NOTE — Telephone Encounter (Signed)
Patient would like to talk to you about pills to help him stop drinking please call [phone]

## 2019-04-04 NOTE — Telephone Encounter (Signed)
Patient states that he is down to 3-4 drinks a day.                                            Arthur Johnson is determined to stop the drinking , smoking , ect.

## 2019-04-07 ENCOUNTER — Encounter (HOSPITAL_COMMUNITY): Payer: Self-pay | Admitting: Emergency Medicine

## 2019-04-07 ENCOUNTER — Emergency Department (HOSPITAL_COMMUNITY): Payer: Medicare Other

## 2019-04-07 ENCOUNTER — Other Ambulatory Visit: Payer: Self-pay

## 2019-04-07 ENCOUNTER — Inpatient Hospital Stay (HOSPITAL_COMMUNITY)
Admission: EM | Admit: 2019-04-07 | Discharge: 2019-04-09 | DRG: 641 | Discharge status: Against Medical Advice | Payer: Medicare Other | Attending: Family Medicine | Admitting: Family Medicine

## 2019-04-07 DIAGNOSIS — Z23 Encounter for immunization: Secondary | ICD-10-CM | POA: Diagnosis present

## 2019-04-07 DIAGNOSIS — M549 Dorsalgia, unspecified: Secondary | ICD-10-CM | POA: Diagnosis present

## 2019-04-07 DIAGNOSIS — K219 Gastro-esophageal reflux disease without esophagitis: Secondary | ICD-10-CM | POA: Diagnosis present

## 2019-04-07 DIAGNOSIS — G8929 Other chronic pain: Secondary | ICD-10-CM | POA: Diagnosis present

## 2019-04-07 DIAGNOSIS — K7030 Alcoholic cirrhosis of liver without ascites: Secondary | ICD-10-CM | POA: Diagnosis present

## 2019-04-07 DIAGNOSIS — Z20828 Contact with and (suspected) exposure to other viral communicable diseases: Secondary | ICD-10-CM | POA: Diagnosis present

## 2019-04-07 DIAGNOSIS — K766 Portal hypertension: Secondary | ICD-10-CM | POA: Diagnosis present

## 2019-04-07 DIAGNOSIS — E8770 Fluid overload, unspecified: Secondary | ICD-10-CM | POA: Diagnosis present

## 2019-04-07 DIAGNOSIS — E871 Hypo-osmolality and hyponatremia: Principal | ICD-10-CM | POA: Diagnosis present

## 2019-04-07 DIAGNOSIS — E876 Hypokalemia: Secondary | ICD-10-CM

## 2019-04-07 DIAGNOSIS — Y908 Blood alcohol level of 240 mg/100 ml or more: Secondary | ICD-10-CM | POA: Diagnosis present

## 2019-04-07 DIAGNOSIS — J449 Chronic obstructive pulmonary disease, unspecified: Secondary | ICD-10-CM | POA: Diagnosis present

## 2019-04-07 DIAGNOSIS — Z888 Allergy status to other drugs, medicaments and biological substances status: Secondary | ICD-10-CM

## 2019-04-07 DIAGNOSIS — F1010 Alcohol abuse, uncomplicated: Secondary | ICD-10-CM | POA: Diagnosis present

## 2019-04-07 DIAGNOSIS — Z886 Allergy status to analgesic agent status: Secondary | ICD-10-CM | POA: Diagnosis not present

## 2019-04-07 DIAGNOSIS — M7989 Other specified soft tissue disorders: Secondary | ICD-10-CM | POA: Diagnosis not present

## 2019-04-07 DIAGNOSIS — I10 Essential (primary) hypertension: Secondary | ICD-10-CM | POA: Diagnosis present

## 2019-04-07 DIAGNOSIS — R9431 Abnormal electrocardiogram [ECG] [EKG]: Secondary | ICD-10-CM | POA: Diagnosis present

## 2019-04-07 DIAGNOSIS — Z79899 Other long term (current) drug therapy: Secondary | ICD-10-CM

## 2019-04-07 LAB — BRAIN NATRIURETIC PEPTIDE: B Natriuretic Peptide: 45 pg/mL (ref 0.0–100.0)

## 2019-04-07 LAB — SODIUM, URINE, RANDOM: Sodium, Ur: 43 mmol/L

## 2019-04-07 LAB — MAGNESIUM: Magnesium: 1.1 mg/dL — ABNORMAL LOW (ref 1.7–2.4)

## 2019-04-07 LAB — CBC
HCT: 36.3 % — ABNORMAL LOW (ref 39.0–52.0)
Hemoglobin: 13 g/dL (ref 13.0–17.0)
MCH: 32.9 pg (ref 26.0–34.0)
MCHC: 35.8 g/dL (ref 30.0–36.0)
MCV: 91.9 fL (ref 80.0–100.0)
Platelets: 178 10*3/uL (ref 150–400)
RBC: 3.95 MIL/uL — ABNORMAL LOW (ref 4.22–5.81)
RDW: 16.6 % — ABNORMAL HIGH (ref 11.5–15.5)
WBC: 7.2 10*3/uL (ref 4.0–10.5)
nRBC: 0 % (ref 0.0–0.2)

## 2019-04-07 LAB — COMPREHENSIVE METABOLIC PANEL
ALT: 36 U/L (ref 0–44)
AST: 74 U/L — ABNORMAL HIGH (ref 15–41)
Albumin: 3.1 g/dL — ABNORMAL LOW (ref 3.5–5.0)
Alkaline Phosphatase: 113 U/L (ref 38–126)
Anion gap: 18 — ABNORMAL HIGH (ref 5–15)
BUN: <5 mg/dL — ABNORMAL LOW (ref 6–20)
CO2: 25 mmol/L (ref 22–32)
Calcium: 7.3 mg/dL — ABNORMAL LOW (ref 8.9–10.3)
Chloride: 72 mmol/L — ABNORMAL LOW (ref 98–111)
Creatinine, Ser: 0.6 mg/dL — ABNORMAL LOW (ref 0.61–1.24)
GFR calc Af Amer: >60 mL/min (ref >60)
GFR calc non Af Amer: >60 mL/min (ref >60)
Glucose, Bld: 124 mg/dL — ABNORMAL HIGH (ref 70–99)
Potassium: 2.8 mmol/L — ABNORMAL LOW (ref 3.5–5.1)
Sodium: 115 mmol/L — CL (ref 135–145)
Total Bilirubin: 3 mg/dL — ABNORMAL HIGH (ref 0.3–1.2)
Total Protein: 6.7 g/dL (ref 6.5–8.1)

## 2019-04-07 LAB — PROTIME-INR
INR: 1.2 (ref 0.8–1.2)
Prothrombin Time: 14.9 seconds (ref 11.4–15.2)

## 2019-04-07 LAB — ETHANOL: Alcohol, Ethyl (B): 351 mg/dL (ref <10)

## 2019-04-07 MED ORDER — THIAMINE HCL 100 MG PO TABS: 100.0000 mg | ORAL_TABLET | Freq: Every day | ORAL | Status: DC

## 2019-04-07 MED ORDER — POTASSIUM CHLORIDE CRYS ER 20 MEQ PO TBCR
40.0000 meq | EXTENDED_RELEASE_TABLET | Freq: Once | ORAL | Status: AC
  Administered 2019-04-07: 40 meq via ORAL
  Filled 2019-04-07: qty 2

## 2019-04-07 MED ORDER — ADULT MULTIVITAMIN W/MINERALS CH
1.0000 | ORAL_TABLET | Freq: Every day | ORAL | Status: DC
  Administered 2019-04-08 – 2019-04-09 (×2): 1 via ORAL
  Filled 2019-04-07 (×2): qty 1

## 2019-04-07 MED ORDER — THIAMINE HCL 100 MG/ML IJ SOLN: 100.0000 mg | Freq: Every day | INTRAMUSCULAR | Status: DC

## 2019-04-07 MED ORDER — NADOLOL 20 MG PO TABS
20.0000 mg | ORAL_TABLET | Freq: Every day | ORAL | Status: DC
  Administered 2019-04-08 – 2019-04-09 (×2): 20 mg via ORAL
  Filled 2019-04-07 (×5): qty 1

## 2019-04-07 MED ORDER — MAGNESIUM SULFATE 2 GM/50ML IV SOLN: 2.0000 g | Freq: Once | INTRAVENOUS | Status: DC

## 2019-04-07 MED ORDER — SODIUM CHLORIDE 0.9% FLUSH: 3.0000 mL | INTRAVENOUS | Status: DC | PRN

## 2019-04-07 MED ORDER — ENOXAPARIN SODIUM 40 MG/0.4ML ~~LOC~~ SOLN
40.0000 mg | SUBCUTANEOUS | Status: DC
  Administered 2019-04-08 – 2019-04-09 (×2): 40 mg via SUBCUTANEOUS
  Filled 2019-04-07 (×2): qty 0.4

## 2019-04-07 MED ORDER — SODIUM CHLORIDE 0.9% FLUSH
3.0000 mL | Freq: Two times a day (BID) | INTRAVENOUS | Status: DC
  Administered 2019-04-08 (×2): 3 mL via INTRAVENOUS

## 2019-04-07 MED ORDER — MAGNESIUM SULFATE 2 GM/50ML IV SOLN
2.0000 g | Freq: Once | INTRAVENOUS | Status: AC
  Administered 2019-04-07: 2 g via INTRAVENOUS
  Filled 2019-04-07: qty 50

## 2019-04-07 MED ORDER — FOLIC ACID 1 MG PO TABS
1.0000 mg | ORAL_TABLET | Freq: Every day | ORAL | Status: DC
  Administered 2019-04-08 – 2019-04-09 (×2): 1 mg via ORAL
  Filled 2019-04-07 (×2): qty 1

## 2019-04-07 MED ORDER — SODIUM CHLORIDE 0.9 % IV SOLN: 250.0000 mL | INTRAVENOUS | Status: DC | PRN

## 2019-04-07 MED ORDER — LORAZEPAM 2 MG/ML IJ SOLN
1.0000 mg | INTRAMUSCULAR | Status: DC | PRN
  Administered 2019-04-08: 1 mg via INTRAVENOUS
  Filled 2019-04-07: qty 1

## 2019-04-07 MED ORDER — POTASSIUM CHLORIDE 10 MEQ/100ML IV SOLN
10.0000 meq | INTRAVENOUS | Status: AC
  Administered 2019-04-07 – 2019-04-08 (×3): 10 meq via INTRAVENOUS
  Filled 2019-04-07 (×3): qty 100

## 2019-04-07 MED ORDER — LORAZEPAM 1 MG PO TABS: 1.0000 mg | ORAL_TABLET | ORAL | Status: DC | PRN

## 2019-04-07 MED ORDER — VITAMIN B-1 100 MG PO TABS
100.0000 mg | ORAL_TABLET | Freq: Every day | ORAL | Status: DC
  Administered 2019-04-08 – 2019-04-09 (×2): 100 mg via ORAL
  Filled 2019-04-07 (×2): qty 1

## 2019-04-07 NOTE — ED Notes (Signed)
Date and time results received: 04/07/19 6:27 PM  (use smartphrase ".now" to insert current time)  Test: sodium Critical Value: 115  Name of Provider Notified: Dr Laverta [place]   Orders Received? Or Actions Taken?:

## 2019-04-07 NOTE — ED Notes (Signed)
Covid swab sent to lab; pt assisted to bathroom and back to bed with some assistance

## 2019-04-07 NOTE — ED Notes (Signed)
Pt placed on O2 by [place] at 4L for O2 sats of 86%

## 2019-04-07 NOTE — ED Notes (Signed)
Pt given meal tray.

## 2019-04-07 NOTE — ED Triage Notes (Signed)
Edema to bilateral lower extremities x 2 days

## 2019-04-07 NOTE — ED Notes (Signed)
CRITICAL VALUE ALERT  Critical Value:  ETOH 351  Date & Time Notied:  04/07/2019 2130  Provider Notified: Dr. Laverta Baltimore  Orders Received/Actions taken: see chart
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
## 2019-04-08 DIAGNOSIS — E876 Hypokalemia: Secondary | ICD-10-CM | POA: Diagnosis not present

## 2019-04-08 DIAGNOSIS — E871 Hypo-osmolality and hyponatremia: Secondary | ICD-10-CM | POA: Diagnosis not present

## 2019-04-08 DIAGNOSIS — F1010 Alcohol abuse, uncomplicated: Secondary | ICD-10-CM | POA: Diagnosis not present

## 2019-04-08 LAB — CBC
HCT: 35.4 % — ABNORMAL LOW (ref 39.0–52.0)
Hemoglobin: 12.3 g/dL — ABNORMAL LOW (ref 13.0–17.0)
MCH: 32.3 pg (ref 26.0–34.0)
MCHC: 34.7 g/dL (ref 30.0–36.0)
MCV: 92.9 fL (ref 80.0–100.0)
Platelets: 157 10*3/uL (ref 150–400)
RBC: 3.81 MIL/uL — ABNORMAL LOW (ref 4.22–5.81)
RDW: 16.8 % — ABNORMAL HIGH (ref 11.5–15.5)
WBC: 8.5 10*3/uL (ref 4.0–10.5)
nRBC: 0 % (ref 0.0–0.2)

## 2019-04-08 LAB — MAGNESIUM
Magnesium: 1.7 mg/dL (ref 1.7–2.4)
Magnesium: 1.7 mg/dL (ref 1.7–2.4)

## 2019-04-08 LAB — COMPREHENSIVE METABOLIC PANEL
ALT: 31 U/L (ref 0–44)
ALT: 29 U/L (ref 0–44)
AST: 66 U/L — ABNORMAL HIGH (ref 15–41)
AST: 70 U/L — ABNORMAL HIGH (ref 15–41)
Albumin: 2.8 g/dL — ABNORMAL LOW (ref 3.5–5.0)
Albumin: 2.9 g/dL — ABNORMAL LOW (ref 3.5–5.0)
Alkaline Phosphatase: 101 U/L (ref 38–126)
Alkaline Phosphatase: 107 U/L (ref 38–126)
Anion gap: 16 — ABNORMAL HIGH (ref 5–15)
Anion gap: 14 (ref 5–15)
BUN: <5 mg/dL — ABNORMAL LOW (ref 6–20)
BUN: <5 mg/dL — ABNORMAL LOW (ref 6–20)
CO2: 24 mmol/L (ref 22–32)
CO2: 28 mmol/L (ref 22–32)
Calcium: 7.1 mg/dL — ABNORMAL LOW (ref 8.9–10.3)
Calcium: 7.6 mg/dL — ABNORMAL LOW (ref 8.9–10.3)
Chloride: 79 mmol/L — ABNORMAL LOW (ref 98–111)
Chloride: 81 mmol/L — ABNORMAL LOW (ref 98–111)
Creatinine, Ser: 0.56 mg/dL — ABNORMAL LOW (ref 0.61–1.24)
Creatinine, Ser: 0.65 mg/dL (ref 0.61–1.24)
GFR calc Af Amer: >60 mL/min (ref >60)
GFR calc Af Amer: >60 mL/min (ref >60)
GFR calc non Af Amer: >60 mL/min (ref >60)
GFR calc non Af Amer: >60 mL/min (ref >60)
Glucose, Bld: 135 mg/dL — ABNORMAL HIGH (ref 70–99)
Glucose, Bld: 123 mg/dL — ABNORMAL HIGH (ref 70–99)
Potassium: 3.1 mmol/L — ABNORMAL LOW (ref 3.5–5.1)
Potassium: 3.7 mmol/L (ref 3.5–5.1)
Sodium: 119 mmol/L — CL (ref 135–145)
Sodium: 123 mmol/L — ABNORMAL LOW (ref 135–145)
Total Bilirubin: 2.8 mg/dL — ABNORMAL HIGH (ref 0.3–1.2)
Total Bilirubin: 3 mg/dL — ABNORMAL HIGH (ref 0.3–1.2)
Total Protein: 6.1 g/dL — ABNORMAL LOW (ref 6.5–8.1)
Total Protein: 6.4 g/dL — ABNORMAL LOW (ref 6.5–8.1)

## 2019-04-08 LAB — PHOSPHORUS: Phosphorus: 2.8 mg/dL (ref 2.5–4.6)

## 2019-04-08 LAB — SARS CORONAVIRUS 2 (TAT 6-24 HRS): SARS Coronavirus 2: NEGATIVE

## 2019-04-08 LAB — OSMOLALITY, URINE: Osmolality, Ur: 228 mOsm/kg — ABNORMAL LOW (ref 300–900)

## 2019-04-08 LAB — HIV ANTIBODY (ROUTINE TESTING W REFLEX): HIV Screen 4th Generation wRfx: NONREACTIVE

## 2019-04-08 MED ORDER — IPRATROPIUM BROMIDE 0.02 % IN SOLN: 0.5000 mg | Freq: Four times a day (QID) | RESPIRATORY_TRACT | Status: DC | PRN

## 2019-04-08 MED ORDER — THIAMINE HCL 100 MG/ML IJ SOLN
Freq: Once | INTRAVENOUS | Status: AC
  Administered 2019-04-08: 12:00:00 via INTRAVENOUS
  Filled 2019-04-08: qty 1000

## 2019-04-08 MED ORDER — METHOCARBAMOL 500 MG PO TABS
1000.0000 mg | ORAL_TABLET | Freq: Three times a day (TID) | ORAL | Status: DC | PRN
  Administered 2019-04-08: 1000 mg via ORAL
  Filled 2019-04-08: qty 2

## 2019-04-08 MED ORDER — SODIUM CHLORIDE 0.9 % IV BOLUS
500.0000 mL | Freq: Once | INTRAVENOUS | Status: AC
  Administered 2019-04-08: 500 mL via INTRAVENOUS

## 2019-04-08 MED ORDER — INFLUENZA VAC SPLIT QUAD 0.5 ML IM SUSY
0.5000 mL | PREFILLED_SYRINGE | INTRAMUSCULAR | Status: AC
  Administered 2019-04-09: 0.5 mL via INTRAMUSCULAR
  Filled 2019-04-08: qty 0.5

## 2019-04-08 MED ORDER — METHOCARBAMOL 1000 MG/10ML IJ SOLN
1000.0000 mg | Freq: Three times a day (TID) | INTRAVENOUS | Status: DC | PRN
  Filled 2019-04-08 (×2): qty 10

## 2019-04-08 NOTE — ED Notes (Signed)
Date and time results received: 04/08/19 0107 (use smartphrase ".now" to insert current time)  Test: sodium Critical Value: 119  Name of Provider Notified: Dr. Darrick Meigs notified via [place]? Or Actions Taken?: no/na
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
## 2019-04-08 NOTE — Progress Notes (Signed)
Mid-level notified about pt's low BP. 80/40s with a MAP of 64.  Patient asymptomatic.

## 2019-04-08 NOTE — ED Notes (Signed)
Pt assisted to side of bed to [facility] and back to bed

## 2019-04-09 DIAGNOSIS — E871 Hypo-osmolality and hyponatremia: Secondary | ICD-10-CM | POA: Diagnosis not present

## 2019-04-09 LAB — BASIC METABOLIC PANEL
Anion gap: 10 (ref 5–15)
BUN: 5 mg/dL — ABNORMAL LOW (ref 6–20)
CO2: 26 mmol/L (ref 22–32)
Calcium: 7.5 mg/dL — ABNORMAL LOW (ref 8.9–10.3)
Chloride: 89 mmol/L — ABNORMAL LOW (ref 98–111)
Creatinine, Ser: 0.44 mg/dL — ABNORMAL LOW (ref 0.61–1.24)
GFR calc Af Amer: >60 mL/min (ref >60)
GFR calc non Af Amer: >60 mL/min (ref >60)
Glucose, Bld: 95 mg/dL (ref 70–99)
Potassium: 2.8 mmol/L — ABNORMAL LOW (ref 3.5–5.1)
Sodium: 125 mmol/L — ABNORMAL LOW (ref 135–145)

## 2019-04-09 MED ORDER — LORAZEPAM 2 MG/ML IJ SOLN: 0.0000 mg | Freq: Four times a day (QID) | INTRAMUSCULAR | Status: DC

## 2019-04-09 MED ORDER — LORAZEPAM 2 MG/ML IJ SOLN: 1.0000 mg | INTRAMUSCULAR | Status: DC | PRN

## 2019-04-09 MED ORDER — LORAZEPAM 1 MG PO TABS: 1.0000 mg | ORAL_TABLET | ORAL | Status: DC | PRN

## 2019-04-09 MED ORDER — LORAZEPAM 2 MG/ML IJ SOLN: 0.0000 mg | Freq: Two times a day (BID) | INTRAMUSCULAR | Status: DC

## 2019-04-09 MED ORDER — POTASSIUM CHLORIDE CRYS ER 20 MEQ PO TBCR
40.0000 meq | EXTENDED_RELEASE_TABLET | ORAL | Status: AC
  Administered 2019-04-09 (×2): 40 meq via ORAL
  Filled 2019-04-09 (×2): qty 2

## 2019-04-09 MED ORDER — SODIUM CHLORIDE 0.9 % IV BOLUS
500.0000 mL | Freq: Once | INTRAVENOUS | Status: AC
  Administered 2019-04-09: 500 mL via INTRAVENOUS

## 2019-04-09 MED ORDER — POTASSIUM CHLORIDE IN NACL 20-0.9 MEQ/L-% IV SOLN
INTRAVENOUS | Status: DC
  Administered 2019-04-09: 10:00:00 via INTRAVENOUS

## 2019-04-09 MED ORDER — DIAZEPAM 5 MG PO TABS
5.0000 mg | ORAL_TABLET | Freq: Once | ORAL | Status: AC
  Administered 2019-04-09: 5 mg via ORAL
  Filled 2019-04-09: qty 1

## 2019-04-09 NOTE — Progress Notes (Signed)
Mid level notified of continued hypotension
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
## 2019-04-09 NOTE — Progress Notes (Signed)
Patient left against medical advice after being encouraged to stay by daughter and Dr. Denton Brick.  Was showing no signs of alcohol withdrawal before discharge.,  Taken by [facility] to car to be driven home

## 2019-04-24 ENCOUNTER — Other Ambulatory Visit: Payer: Self-pay

## 2019-04-24 ENCOUNTER — Emergency Department (HOSPITAL_COMMUNITY): Payer: Medicare Other

## 2019-04-24 ENCOUNTER — Emergency Department (HOSPITAL_COMMUNITY)
Admission: EM | Admit: 2019-04-24 | Discharge: 2019-04-24 | Disposition: A | Discharge status: Home / Self Care | Payer: Medicare Other | Attending: Emergency Medicine | Admitting: Emergency Medicine

## 2019-04-24 ENCOUNTER — Encounter (HOSPITAL_COMMUNITY): Payer: Self-pay

## 2019-04-24 DIAGNOSIS — Z79899 Other long term (current) drug therapy: Secondary | ICD-10-CM | POA: Insufficient documentation

## 2019-04-24 DIAGNOSIS — M546 Pain in thoracic spine: Secondary | ICD-10-CM | POA: Diagnosis not present

## 2019-04-24 DIAGNOSIS — R140 Abdominal distension (gaseous): Secondary | ICD-10-CM

## 2019-04-24 DIAGNOSIS — F17210 Nicotine dependence, cigarettes, uncomplicated: Secondary | ICD-10-CM | POA: Insufficient documentation

## 2019-04-24 DIAGNOSIS — J449 Chronic obstructive pulmonary disease, unspecified: Secondary | ICD-10-CM | POA: Insufficient documentation

## 2019-04-24 DIAGNOSIS — K7031 Alcoholic cirrhosis of liver with ascites: Secondary | ICD-10-CM | POA: Diagnosis not present

## 2019-04-24 DIAGNOSIS — I10 Essential (primary) hypertension: Secondary | ICD-10-CM | POA: Diagnosis not present

## 2019-04-24 DIAGNOSIS — G8929 Other chronic pain: Secondary | ICD-10-CM | POA: Diagnosis not present

## 2019-04-24 LAB — COMPREHENSIVE METABOLIC PANEL
ALT: 21 U/L (ref 0–44)
AST: 55 U/L — ABNORMAL HIGH (ref 15–41)
Albumin: 2.6 g/dL — ABNORMAL LOW (ref 3.5–5.0)
Alkaline Phosphatase: 110 U/L (ref 38–126)
Anion gap: 11 (ref 5–15)
BUN: <5 mg/dL — ABNORMAL LOW (ref 6–20)
CO2: 24 mmol/L (ref 22–32)
Calcium: 8 mg/dL — ABNORMAL LOW (ref 8.9–10.3)
Chloride: 98 mmol/L (ref 98–111)
Creatinine, Ser: 0.38 mg/dL — ABNORMAL LOW (ref 0.61–1.24)
GFR calc Af Amer: >60 mL/min (ref >60)
GFR calc non Af Amer: >60 mL/min (ref >60)
Glucose, Bld: 147 mg/dL — ABNORMAL HIGH (ref 70–99)
Potassium: 3.8 mmol/L (ref 3.5–5.1)
Sodium: 133 mmol/L — ABNORMAL LOW (ref 135–145)
Total Bilirubin: 1.8 mg/dL — ABNORMAL HIGH (ref 0.3–1.2)
Total Protein: 6.2 g/dL — ABNORMAL LOW (ref 6.5–8.1)

## 2019-04-24 LAB — CBC WITH DIFFERENTIAL/PLATELET
Abs Immature Granulocytes: 0.02 10*3/uL (ref 0.00–0.07)
Basophils Absolute: 0 10*3/uL (ref 0.0–0.1)
Basophils Relative: 1 %
Eosinophils Absolute: 0.1 10*3/uL (ref 0.0–0.5)
Eosinophils Relative: 2 %
HCT: 33.7 % — ABNORMAL LOW (ref 39.0–52.0)
Hemoglobin: 11.2 g/dL — ABNORMAL LOW (ref 13.0–17.0)
Immature Granulocytes: 0 %
Lymphocytes Relative: 25 %
Lymphs Abs: 1.3 10*3/uL (ref 0.7–4.0)
MCH: 33.2 pg (ref 26.0–34.0)
MCHC: 33.2 g/dL (ref 30.0–36.0)
MCV: 100 fL (ref 80.0–100.0)
Monocytes Absolute: 1 10*3/uL (ref 0.1–1.0)
Monocytes Relative: 19 %
Neutro Abs: 2.7 10*3/uL (ref 1.7–7.7)
Neutrophils Relative %: 53 %
Platelets: 164 10*3/uL (ref 150–400)
RBC: 3.37 MIL/uL — ABNORMAL LOW (ref 4.22–5.81)
RDW: 17.8 % — ABNORMAL HIGH (ref 11.5–15.5)
WBC: 5.1 10*3/uL (ref 4.0–10.5)
nRBC: 0 % (ref 0.0–0.2)

## 2019-04-24 LAB — URINALYSIS, ROUTINE W REFLEX MICROSCOPIC
Bilirubin Urine: NEGATIVE
Glucose, UA: NEGATIVE mg/dL
Hgb urine dipstick: NEGATIVE
Ketones, ur: NEGATIVE mg/dL
Leukocytes,Ua: NEGATIVE
Nitrite: NEGATIVE
Protein, ur: NEGATIVE mg/dL
Specific Gravity, Urine: 1.009 (ref 1.005–1.030)
pH: 6 (ref 5.0–8.0)

## 2019-04-24 LAB — AMMONIA: Ammonia: 47 umol/L — ABNORMAL HIGH (ref 9–35)

## 2019-04-24 LAB — LIPASE, BLOOD: Lipase: 93 U/L — ABNORMAL HIGH (ref 11–51)

## 2019-04-24 LAB — TROPONIN I (HIGH SENSITIVITY): Troponin I (High Sensitivity): 3 ng/L (ref <18)

## 2019-04-24 MED ORDER — MORPHINE SULFATE (PF) 4 MG/ML IV SOLN
4.0000 mg | Freq: Once | INTRAVENOUS | Status: AC
  Administered 2019-04-24: 4 mg via INTRAVENOUS
  Filled 2019-04-24: qty 1

## 2019-04-24 MED ORDER — ONDANSETRON HCL 4 MG/2ML IJ SOLN
4.0000 mg | Freq: Once | INTRAMUSCULAR | Status: AC
  Administered 2019-04-24: 4 mg via INTRAVENOUS
  Filled 2019-04-24: qty 2

## 2019-04-24 MED ORDER — DOXYCYCLINE HYCLATE 100 MG PO CAPS: 100.0000 mg | ORAL_CAPSULE | Freq: Two times a day (BID) | ORAL | 0 refills | Status: DC

## 2019-04-24 MED ORDER — OXYCODONE HCL 5 MG PO TABS
5.0000 mg | ORAL_TABLET | Freq: Once | ORAL | Status: AC
  Administered 2019-04-24: 5 mg via ORAL
  Filled 2019-04-24: qty 1

## 2019-04-24 MED ORDER — OXYCODONE HCL 5 MG PO TABS: 5.0000 mg | ORAL_TABLET | Freq: Four times a day (QID) | ORAL | 0 refills | Status: DC | PRN

## 2019-04-24 NOTE — ED Notes (Signed)
Patient advised we needed urine specimen.  Urinal placed at bedside.

## 2019-04-24 NOTE — Procedures (Signed)
PreOperative Dx: Cirrhosis, ascites Postoperative Dx: Cirrhosis, ascites Procedure:   US guided paracentesis Radiologist:  Thornton Papas Anesthesia:  10 ml of1% lidocaine Specimen:  1 L of yellow ascitic fluid EBL:   < 1 ml Complications: None

## 2019-04-24 NOTE — Progress Notes (Signed)
Paracentesis complete no signs of distress.  

## 2019-04-24 NOTE — ED Triage Notes (Signed)
Pt reports that he has been retaining fluid in legs and abdomen for several days. Plus severe back pain Pt reports he has a bad liver and is trying to quit drinking
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
## 2019-04-28 ENCOUNTER — Ambulatory Visit (INDEPENDENT_AMBULATORY_CARE_PROVIDER_SITE_OTHER): Payer: Medicare Other | Admitting: Otolaryngology

## 2019-05-04 ENCOUNTER — Other Ambulatory Visit: Payer: Self-pay

## 2019-05-04 ENCOUNTER — Encounter (HOSPITAL_COMMUNITY): Payer: Self-pay | Admitting: *Deleted

## 2019-05-04 ENCOUNTER — Emergency Department (HOSPITAL_COMMUNITY): Payer: Medicare Other

## 2019-05-04 ENCOUNTER — Emergency Department (HOSPITAL_COMMUNITY)
Admission: EM | Admit: 2019-05-04 | Discharge: 2019-05-04 | Disposition: A | Discharge status: Home / Self Care | Payer: Medicare Other | Attending: Emergency Medicine | Admitting: Emergency Medicine

## 2019-05-04 DIAGNOSIS — Z79899 Other long term (current) drug therapy: Secondary | ICD-10-CM | POA: Insufficient documentation

## 2019-05-04 DIAGNOSIS — R0602 Shortness of breath: Secondary | ICD-10-CM | POA: Diagnosis present

## 2019-05-04 DIAGNOSIS — J189 Pneumonia, unspecified organism: Secondary | ICD-10-CM

## 2019-05-04 DIAGNOSIS — F17210 Nicotine dependence, cigarettes, uncomplicated: Secondary | ICD-10-CM | POA: Insufficient documentation

## 2019-05-04 DIAGNOSIS — Z20828 Contact with and (suspected) exposure to other viral communicable diseases: Secondary | ICD-10-CM | POA: Insufficient documentation

## 2019-05-04 DIAGNOSIS — I10 Essential (primary) hypertension: Secondary | ICD-10-CM | POA: Insufficient documentation

## 2019-05-04 DIAGNOSIS — J449 Chronic obstructive pulmonary disease, unspecified: Secondary | ICD-10-CM | POA: Insufficient documentation

## 2019-05-04 HISTORY — DX: Pneumonia, unspecified organism: J18.9

## 2019-05-04 LAB — CBC WITH DIFFERENTIAL/PLATELET
Abs Immature Granulocytes: 0.01 10*3/uL (ref 0.00–0.07)
Basophils Absolute: 0 10*3/uL (ref 0.0–0.1)
Basophils Relative: 1 %
Eosinophils Absolute: 0 10*3/uL (ref 0.0–0.5)
Eosinophils Relative: 0 %
HCT: 34.8 % — ABNORMAL LOW (ref 39.0–52.0)
Hemoglobin: 11.7 g/dL — ABNORMAL LOW (ref 13.0–17.0)
Immature Granulocytes: 0 %
Lymphocytes Relative: 30 %
Lymphs Abs: 1.9 10*3/uL (ref 0.7–4.0)
MCH: 32.2 pg (ref 26.0–34.0)
MCHC: 33.6 g/dL (ref 30.0–36.0)
MCV: 95.9 fL (ref 80.0–100.0)
Monocytes Absolute: 0.9 10*3/uL (ref 0.1–1.0)
Monocytes Relative: 14 %
Neutro Abs: 3.5 10*3/uL (ref 1.7–7.7)
Neutrophils Relative %: 55 %
Platelets: 182 10*3/uL (ref 150–400)
RBC: 3.63 MIL/uL — ABNORMAL LOW (ref 4.22–5.81)
RDW: 17 % — ABNORMAL HIGH (ref 11.5–15.5)
WBC: 6.3 10*3/uL (ref 4.0–10.5)
nRBC: 0 % (ref 0.0–0.2)

## 2019-05-04 LAB — COMPREHENSIVE METABOLIC PANEL
ALT: 21 U/L (ref 0–44)
AST: 53 U/L — ABNORMAL HIGH (ref 15–41)
Albumin: 2.8 g/dL — ABNORMAL LOW (ref 3.5–5.0)
Alkaline Phosphatase: 102 U/L (ref 38–126)
Anion gap: 12 (ref 5–15)
BUN: 7 mg/dL (ref 6–20)
CO2: 24 mmol/L (ref 22–32)
Calcium: 8 mg/dL — ABNORMAL LOW (ref 8.9–10.3)
Chloride: 94 mmol/L — ABNORMAL LOW (ref 98–111)
Creatinine, Ser: 0.55 mg/dL — ABNORMAL LOW (ref 0.61–1.24)
GFR calc Af Amer: >60 mL/min (ref >60)
GFR calc non Af Amer: >60 mL/min (ref >60)
Glucose, Bld: 115 mg/dL — ABNORMAL HIGH (ref 70–99)
Potassium: 3.8 mmol/L (ref 3.5–5.1)
Sodium: 130 mmol/L — ABNORMAL LOW (ref 135–145)
Total Bilirubin: 3.7 mg/dL — ABNORMAL HIGH (ref 0.3–1.2)
Total Protein: 6.6 g/dL (ref 6.5–8.1)

## 2019-05-04 MED ORDER — SODIUM CHLORIDE 0.9 % IV BOLUS
1000.0000 mL | Freq: Once | INTRAVENOUS | Status: AC
  Administered 2019-05-04: 22:00:00 1000 mL via INTRAVENOUS

## 2019-05-04 MED ORDER — HYDROMORPHONE HCL 1 MG/ML IJ SOLN: 0.5000 mg | Freq: Once | INTRAMUSCULAR | Status: DC

## 2019-05-04 MED ORDER — LEVOFLOXACIN 500 MG PO TABS: 500.0000 mg | ORAL_TABLET | Freq: Every day | ORAL | 0 refills | Status: DC

## 2019-05-04 MED ORDER — MORPHINE SULFATE (PF) 4 MG/ML IV SOLN
4.0000 mg | Freq: Once | INTRAVENOUS | Status: AC
  Administered 2019-05-04: 4 mg via INTRAVENOUS
  Filled 2019-05-04: qty 1

## 2019-05-04 MED ORDER — ONDANSETRON HCL 4 MG/2ML IJ SOLN
4.0000 mg | Freq: Once | INTRAMUSCULAR | Status: AC
  Administered 2019-05-04: 4 mg via INTRAVENOUS
  Filled 2019-05-04: qty 2

## 2019-05-04 NOTE — ED Triage Notes (Signed)
Pt with recent dx of PNA about a month ago per pt.  Pt admits to having some SOB.
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
## 2019-05-04 NOTE — Discharge Instructions (Signed)
Follow-up with your doctor next week for recheck 

## 2019-05-04 NOTE — ED Triage Notes (Signed)
Pt with emesis that started yesterday.

## 2019-05-05 LAB — SARS CORONAVIRUS 2 (TAT 6-24 HRS): SARS Coronavirus 2: NEGATIVE

## 2019-05-31 ENCOUNTER — Other Ambulatory Visit (INDEPENDENT_AMBULATORY_CARE_PROVIDER_SITE_OTHER): Payer: Self-pay | Admitting: Internal Medicine

## 2019-06-02 NOTE — Telephone Encounter (Signed)
Patient will need to have a OV prior to further refills.Mitzie - please let me know when so that I may order lab work.

## 2019-07-02 ENCOUNTER — Telehealth (INDEPENDENT_AMBULATORY_CARE_PROVIDER_SITE_OTHER): Payer: Self-pay | Admitting: *Deleted

## 2019-07-02 NOTE — Telephone Encounter (Signed)
Talked with Arthur Johnson - he is asking if he can stop all the medications that Dr.Rehman has him on. He has not drank in 4 months. His weight stays around 140 lbs.  He says the last time that he was in the hospital he volunteered for to have fluid drawn from his abdomen. He reports no fluid.  Patient has appointment with Thayer Headings on 07/16/19  , he is requesting that he have  His lab work prior to that visit.  Patient was advised that this would be reviewed with the providers .

## 2019-07-03 NOTE — Telephone Encounter (Signed)
Last note available in epic reviewed and he was on Lasix and spironolactone, that visit was over a year ago in 2019 with Dr. Laural Golden.  I would not recommend medication changes until he is seen in the office.  He is overdue for follow-up of his alcoholic cirrhosis, labs, imaging, etc.

## 2019-07-04 NOTE — Telephone Encounter (Signed)
Patient will be seen on 07/16/19. Labs and Medication will be discussed at that time.

## 2019-07-07 ENCOUNTER — Encounter (INDEPENDENT_AMBULATORY_CARE_PROVIDER_SITE_OTHER): Payer: Self-pay

## 2019-07-16 ENCOUNTER — Ambulatory Visit (INDEPENDENT_AMBULATORY_CARE_PROVIDER_SITE_OTHER): Payer: Medicare Other | Admitting: Gastroenterology

## 2019-10-13 ENCOUNTER — Other Ambulatory Visit (INDEPENDENT_AMBULATORY_CARE_PROVIDER_SITE_OTHER): Payer: Self-pay | Admitting: Internal Medicine

## 2019-10-13 DIAGNOSIS — K7031 Alcoholic cirrhosis of liver with ascites: Secondary | ICD-10-CM

## 2019-10-15 ENCOUNTER — Other Ambulatory Visit (INDEPENDENT_AMBULATORY_CARE_PROVIDER_SITE_OTHER): Payer: Self-pay | Admitting: *Deleted

## 2019-10-15 DIAGNOSIS — K7290 Hepatic failure, unspecified without coma: Secondary | ICD-10-CM

## 2019-10-15 DIAGNOSIS — K7682 Hepatic encephalopathy: Secondary | ICD-10-CM

## 2019-10-15 DIAGNOSIS — K7031 Alcoholic cirrhosis of liver with ascites: Secondary | ICD-10-CM

## 2019-10-15 NOTE — Telephone Encounter (Signed)
Patient will need to have office visit. He was last seen in the office 05/14/18. We will also request a Metabolic 7 .

## 2019-12-19 ENCOUNTER — Other Ambulatory Visit (INDEPENDENT_AMBULATORY_CARE_PROVIDER_SITE_OTHER): Payer: Self-pay | Admitting: Internal Medicine

## 2020-01-21 ENCOUNTER — Ambulatory Visit (INDEPENDENT_AMBULATORY_CARE_PROVIDER_SITE_OTHER): Payer: Medicare Other | Admitting: Gastroenterology

## 2020-04-27 ENCOUNTER — Other Ambulatory Visit (INDEPENDENT_AMBULATORY_CARE_PROVIDER_SITE_OTHER): Payer: Self-pay | Admitting: Internal Medicine

## 2020-04-27 DIAGNOSIS — K7031 Alcoholic cirrhosis of liver with ascites: Secondary | ICD-10-CM

## 2020-04-27 NOTE — Telephone Encounter (Signed)
Last seen 05/14/2018 by Dr. Laural Golden for Ascites due to cirrhosis, anemia

## 2020-10-10 IMAGING — MR MR HEAD W/O CM
9 of 11 series · 41 of 48 positions shown · non-contrast
Comparison: CT HEAD April 14, 2013

CLINICAL DATA: Staring episode. History of cirrhosis and
hypertension.

EXAM:
MRI HEAD WITHOUT CONTRAST
TECHNIQUE: Multiplanar, multiecho pulse sequences of the brain and surrounding
structures were obtained without intravenous contrast.

[Series 3: DWI · axial · 3.0mm · 0.82mm/px · z∈[-43,+102]mm · 6 of 50 slices shown (1 of 4)]
[im 1/50]
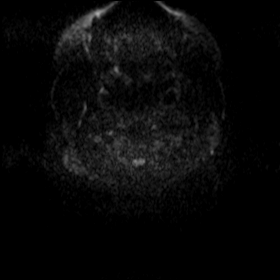
[im 10/50]
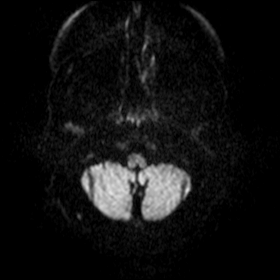
[im 20/50]
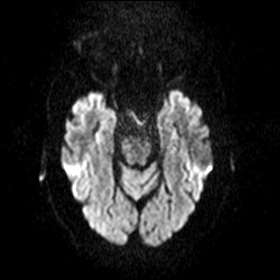
[im 30/50]
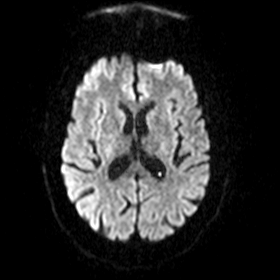
[im 40/50]
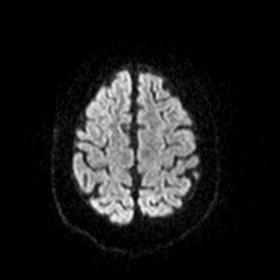
[im 50/50]
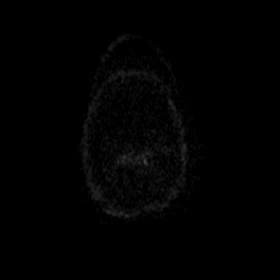

[Series 4: DWI · axial · 3.0mm · 0.82mm/px · z∈[-43,+102]mm · 5 of 49 slices shown (2 of 4)]
[im 1/49]
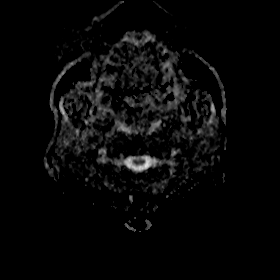
[im 13/49]
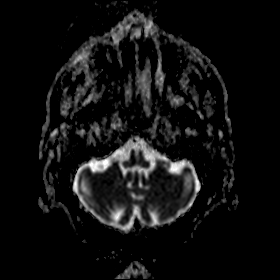
[im 25/49]
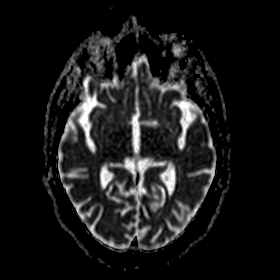
[im 37/49]
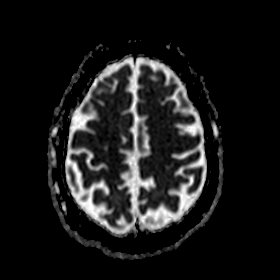
[im 49/49]
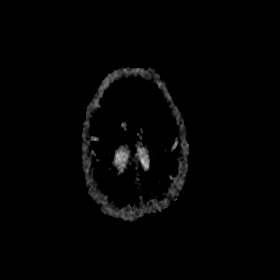

[Series 5: DWI · coronal · 5.0mm · 0.48mm/px · 4 of 32 slices shown (3 of 4)]
[im 1/32]
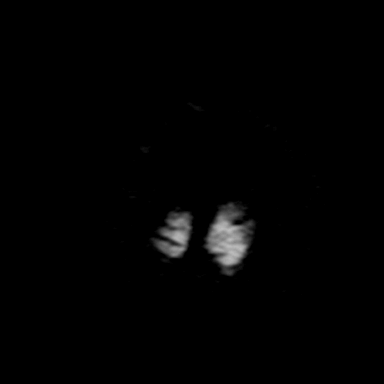
[im 11/32]
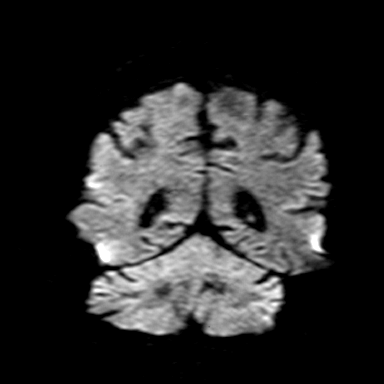
[im 21/32]
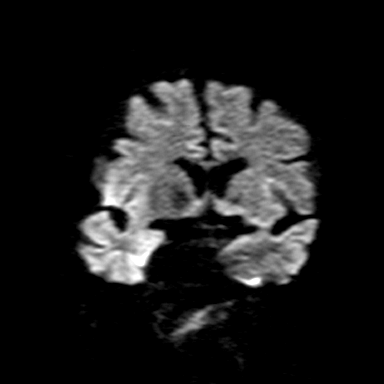
[im 32/32]
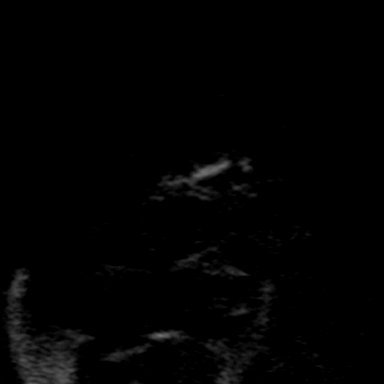

[Series 6: DWI · coronal · 5.0mm · 0.48mm/px · 4 of 32 slices shown (4 of 4)]
[im 1/32]
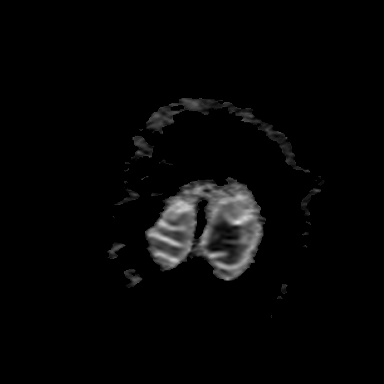
[im 11/32]
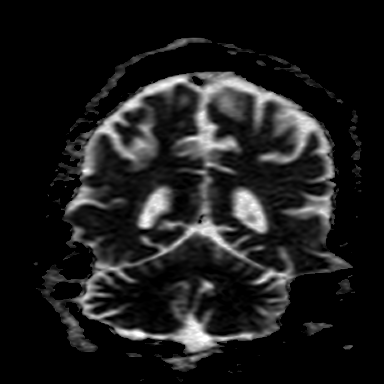
[im 21/32]
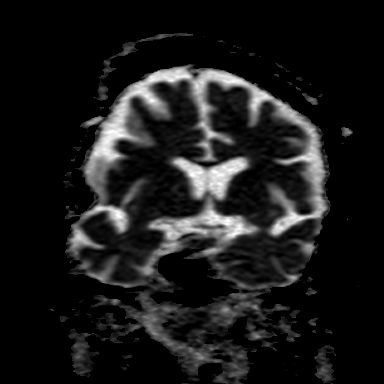
[im 32/32]
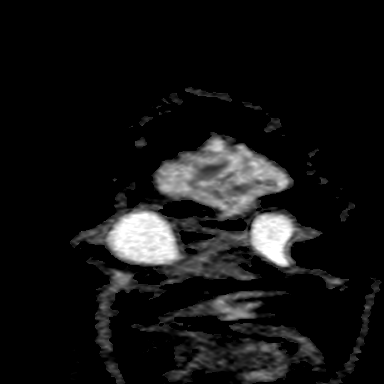

[Series 7: T2 · axial · 5.0mm · 0.75mm/px · z∈[-40,+100]mm · 3 of 23 slices shown (1 of 3)]
[im 1/23]
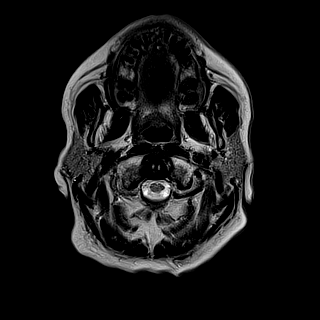
[im 12/23]
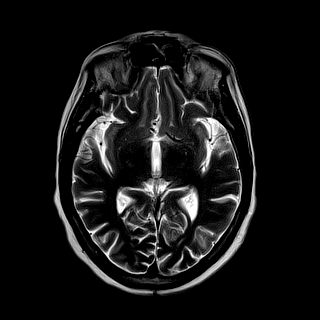
[im 23/23]
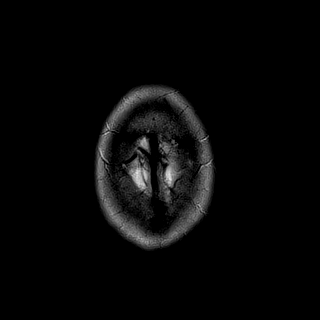

[Series 8: FLAIR · axial · 3.0mm · 0.94mm/px · z∈[-38,+98]mm · 5 of 47 slices shown]
[im 1/47]
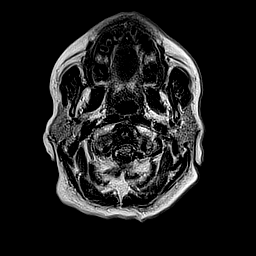
[im 12/47]
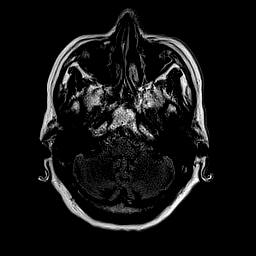
[im 24/47]
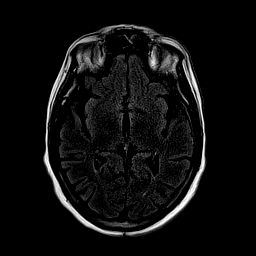
[im 35/47]
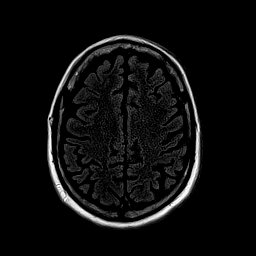
[im 47/47]
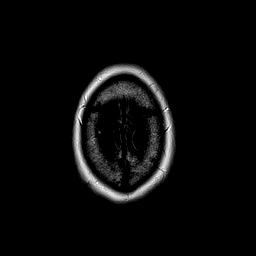

[Series 9: T1 · axial · 2.0mm · 0.47mm/px · z∈[-51,+110]mm · 7 of 94 slices shown]
[im 1/94]
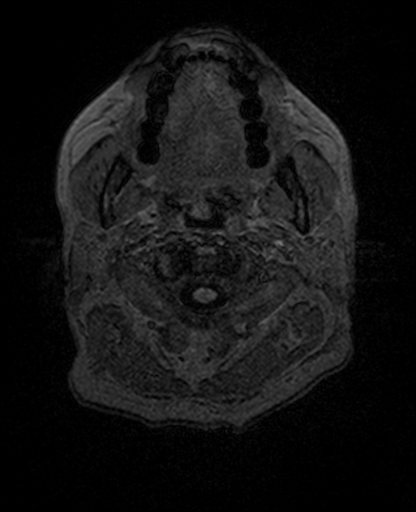
[im 11/94]
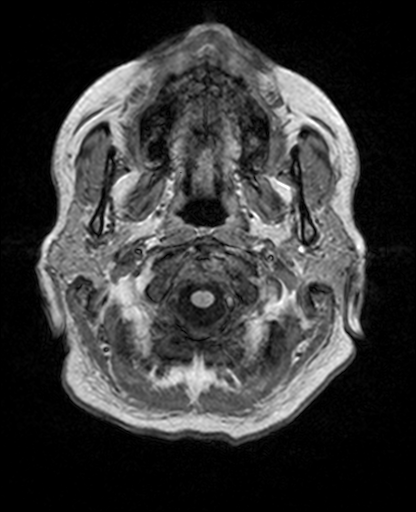
[im 32/94]
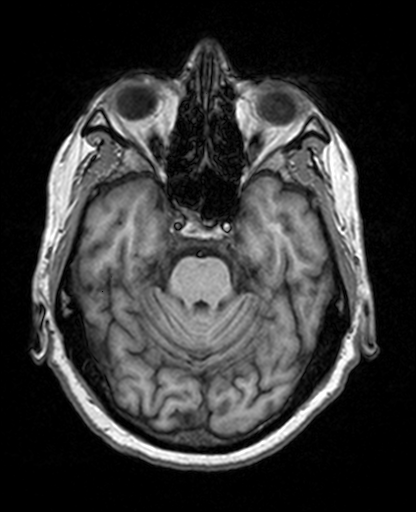
[im 42/94]
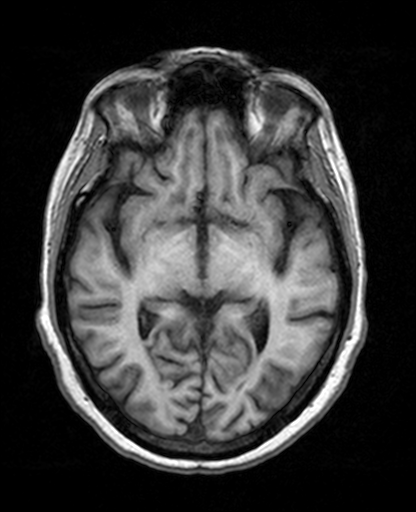
[im 52/94]
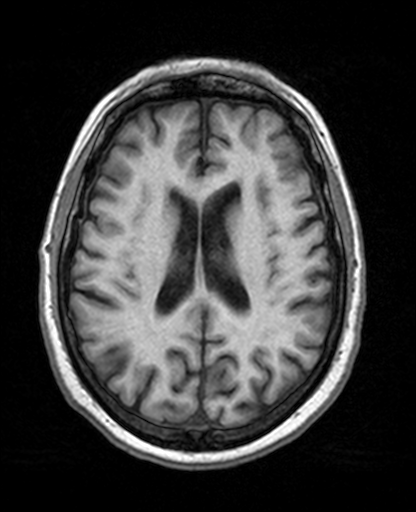
[im 63/94]
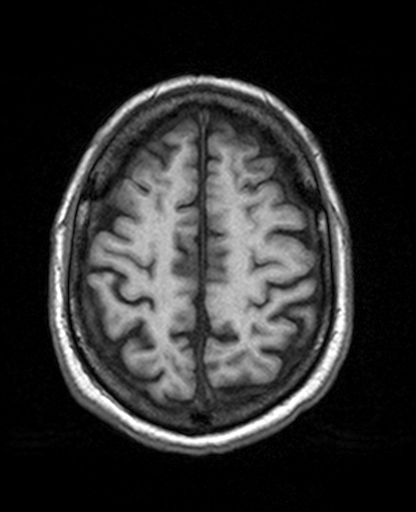
[im 83/94]
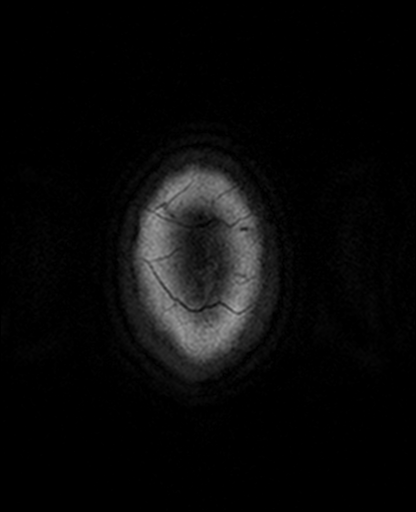

[Series 11: T2 · coronal · 5.0mm · 0.63mm/px · 3 of 28 slices shown (2 of 3)]
[im 1/28]
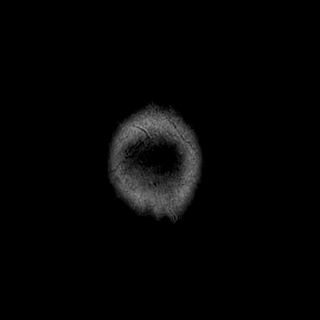
[im 14/28]
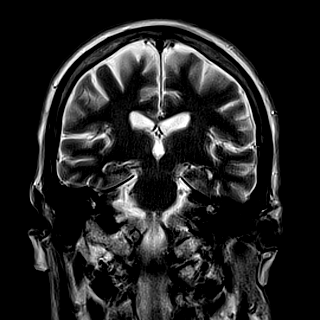
[im 28/28]
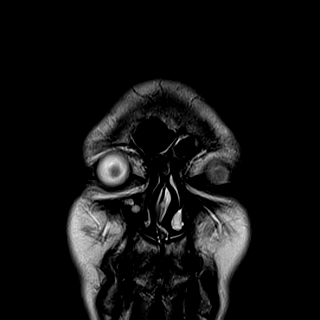

[Series 12: T2 · coronal · 3.0mm · 0.57mm/px · 4 of 36 slices shown (3 of 3)]
[im 1/36]
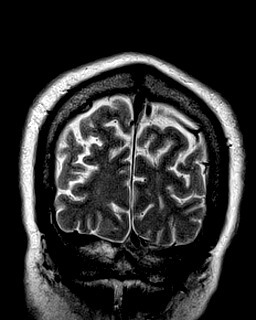
[im 12/36]
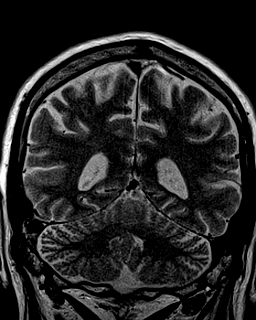
[im 24/36]
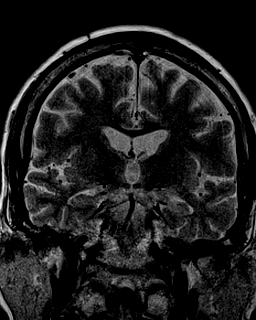
[im 36/36]
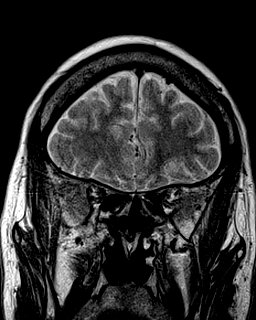

[41 of 48 positions shown; findings below may reference images not displayed]

FINDINGS: INTRACRANIAL CONTENTS: No reduced diffusion to suggest acute
ischemia. No susceptibility artifact to suggest hemorrhage. Mild
global parenchymal brain volume loss. No hydrocephalus. No
suspicious parenchymal signal, masses, mass effect. No abnormal
extra-axial fluid collections. No extra-axial masses. Normal
symmetric hippocampal size, morphology and signal.

VASCULAR: Normal major intracranial vascular flow voids present at
skull base.

SKULL AND UPPER CERVICAL SPINE: No abnormal sellar expansion. No
suspicious calvarial bone marrow signal. Craniocervical junction
maintained.

SINUSES/ORBITS: Mild paranasal sinus mucosal thickening. Mastoid air
cells are well aerated.The included ocular globes and orbital
contents are non-suspicious.

OTHER: None.
IMPRESSION: 1. No acute intracranial process.
2. Mild parenchymal brain volume loss, advanced for age.

## 2021-01-10 ENCOUNTER — Other Ambulatory Visit (INDEPENDENT_AMBULATORY_CARE_PROVIDER_SITE_OTHER): Payer: Self-pay | Admitting: Internal Medicine

## 2021-01-10 ENCOUNTER — Other Ambulatory Visit (INDEPENDENT_AMBULATORY_CARE_PROVIDER_SITE_OTHER): Payer: Self-pay | Admitting: *Deleted

## 2021-01-10 DIAGNOSIS — K7682 Hepatic encephalopathy: Secondary | ICD-10-CM

## 2021-01-10 DIAGNOSIS — K7031 Alcoholic cirrhosis of liver with ascites: Secondary | ICD-10-CM

## 2021-01-10 DIAGNOSIS — K7290 Hepatic failure, unspecified without coma: Secondary | ICD-10-CM

## 2021-01-10 DIAGNOSIS — K766 Portal hypertension: Secondary | ICD-10-CM

## 2021-01-10 DIAGNOSIS — R7401 Elevation of levels of liver transaminase levels: Secondary | ICD-10-CM

## 2021-01-10 NOTE — Telephone Encounter (Signed)
Not seen since 04/2018

## 2021-01-10 NOTE — Telephone Encounter (Signed)
Pt also requesting refill on pantoprazole, spironolactone, furosemide. Ok per dr Laural Golden for one month of each. Needs cbc, cmet, and inr. And ov in one month. Bw orders put in and left message to return call to schedule visit

## 2021-01-11 NOTE — Telephone Encounter (Signed)
I called and left a message asked that the patient please return call.  

## 2021-01-13 NOTE — Telephone Encounter (Signed)
I called and left a message asked that the patient return call.

## 2021-01-14 ENCOUNTER — Telehealth (INDEPENDENT_AMBULATORY_CARE_PROVIDER_SITE_OTHER): Payer: Self-pay

## 2021-01-14 NOTE — Telephone Encounter (Signed)
   Requested Prescriptions   Name from pharmacy: Furosemide 40 MG Oral Tablet       Will file in chart as: furosemide (LASIX) 40 MG tablet   Sig: TAKE 1 TABLET BY MOUTH  DAILY   Disp:  90 tablet    Refills:  3   Start: 01/11/2021   Class: Normal   Non-formulary For: Ascites due to alcoholic cirrhosis ([place])   To pharmacy: Requesting 1 year supply   Last ordered: 1 year ago by [name], MD Last refill: 02/25/2020   Rx #: [id number]     To be filled at: [facility]  ([place], [place]        Medication Refill (Newest Message First) You Just now (10:35 AM)     Left message again asked to return call.      Note    You 22 hours ago (12:04 PM)     I called and left a message asked that the patient return call.       Note    You 3 days ago     I called and left a message asked that the patient please return call.       Note    Carmelina Noun, LPN  You; Carmelina Noun, LPN 4 days ago     Pt also requesting refill on pantoprazole, spironolactone, furosemide. Ok per dr Laural Golden for one month of each. Needs cbc, cmet, and inr. And ov in one month. Bw orders put in and left message to return call to schedule visit       Note    You 4 days ago     Not seen since 04/2018      Note    Interface, Surescripts Out  [employer] 4 days ago   SI  Pharmacy requested follow up on 01/12/2021.

## 2021-01-14 NOTE — Telephone Encounter (Signed)
Left message again asked to return call.

## 2021-01-17 ENCOUNTER — Encounter (INDEPENDENT_AMBULATORY_CARE_PROVIDER_SITE_OTHER): Payer: Self-pay

## 2021-01-17 NOTE — Telephone Encounter (Signed)
Mailed a letter to the patient asked that he please return call.

## 2021-01-31 NOTE — Telephone Encounter (Signed)
Left message to return call 

## 2021-01-31 NOTE — Telephone Encounter (Signed)
No response via phone messages nor mail.

## 2021-02-02 ENCOUNTER — Other Ambulatory Visit (INDEPENDENT_AMBULATORY_CARE_PROVIDER_SITE_OTHER): Payer: Self-pay | Admitting: *Deleted

## 2021-02-02 MED ORDER — PANTOPRAZOLE SODIUM 20 MG PO TBEC: 20.0000 mg | DELAYED_RELEASE_TABLET | Freq: Two times a day (BID) | ORAL | 0 refills | Status: DC

## 2021-02-02 MED ORDER — SPIRONOLACTONE 100 MG PO TABS: 100.0000 mg | ORAL_TABLET | Freq: Two times a day (BID) | ORAL | 0 refills | Status: DC

## 2021-02-22 ENCOUNTER — Other Ambulatory Visit (INDEPENDENT_AMBULATORY_CARE_PROVIDER_SITE_OTHER): Payer: Self-pay | Admitting: Internal Medicine

## 2021-02-28 ENCOUNTER — Other Ambulatory Visit (INDEPENDENT_AMBULATORY_CARE_PROVIDER_SITE_OTHER): Payer: Self-pay | Admitting: Internal Medicine

## 2022-03-28 DIAGNOSIS — F1020 Alcohol dependence, uncomplicated: Secondary | ICD-10-CM | POA: Diagnosis not present

## 2022-03-28 DIAGNOSIS — Z8249 Family history of ischemic heart disease and other diseases of the circulatory system: Secondary | ICD-10-CM | POA: Diagnosis not present

## 2022-03-28 DIAGNOSIS — Z008 Encounter for other general examination: Secondary | ICD-10-CM | POA: Diagnosis not present

## 2022-03-28 DIAGNOSIS — K219 Gastro-esophageal reflux disease without esophagitis: Secondary | ICD-10-CM | POA: Diagnosis not present

## 2022-03-28 DIAGNOSIS — R69 Illness, unspecified: Secondary | ICD-10-CM | POA: Diagnosis not present

## 2022-03-28 DIAGNOSIS — Z720 Tobacco use: Secondary | ICD-10-CM | POA: Diagnosis not present

## 2022-04-12 ENCOUNTER — Encounter (INDEPENDENT_AMBULATORY_CARE_PROVIDER_SITE_OTHER): Payer: Self-pay | Admitting: *Deleted

## 2023-05-15 ENCOUNTER — Ambulatory Visit: Payer: Medicare Other | Admitting: Internal Medicine

## 2023-05-30 ENCOUNTER — Encounter: Payer: Self-pay | Admitting: Internal Medicine

## 2023-06-07 ENCOUNTER — Ambulatory Visit: Payer: Medicare PPO | Admitting: Urgent Care

## 2023-06-27 ENCOUNTER — Ambulatory Visit: Payer: Self-pay | Admitting: Family Medicine

## 2023-07-13 ENCOUNTER — Ambulatory Visit (INDEPENDENT_AMBULATORY_CARE_PROVIDER_SITE_OTHER): Payer: Medicare PPO | Admitting: Internal Medicine

## 2023-07-13 ENCOUNTER — Encounter: Payer: Self-pay | Admitting: Internal Medicine

## 2023-07-13 VITALS — BP 140/65 | HR 111 | Ht 69.0 in | Wt 144.4 lb

## 2023-07-13 DIAGNOSIS — Z1321 Encounter for screening for nutritional disorder: Secondary | ICD-10-CM

## 2023-07-13 DIAGNOSIS — K219 Gastro-esophageal reflux disease without esophagitis: Secondary | ICD-10-CM | POA: Diagnosis not present

## 2023-07-13 DIAGNOSIS — Z13220 Encounter for screening for lipoid disorders: Secondary | ICD-10-CM

## 2023-07-13 DIAGNOSIS — Z131 Encounter for screening for diabetes mellitus: Secondary | ICD-10-CM | POA: Diagnosis not present

## 2023-07-13 DIAGNOSIS — H5789 Other specified disorders of eye and adnexa: Secondary | ICD-10-CM | POA: Insufficient documentation

## 2023-07-13 DIAGNOSIS — M5441 Lumbago with sciatica, right side: Secondary | ICD-10-CM

## 2023-07-13 DIAGNOSIS — M5442 Lumbago with sciatica, left side: Secondary | ICD-10-CM

## 2023-07-13 DIAGNOSIS — K7031 Alcoholic cirrhosis of liver with ascites: Secondary | ICD-10-CM

## 2023-07-13 DIAGNOSIS — F1010 Alcohol abuse, uncomplicated: Secondary | ICD-10-CM

## 2023-07-13 DIAGNOSIS — Z720 Tobacco use: Secondary | ICD-10-CM | POA: Diagnosis not present

## 2023-07-13 DIAGNOSIS — G8929 Other chronic pain: Secondary | ICD-10-CM | POA: Insufficient documentation

## 2023-07-13 DIAGNOSIS — E43 Unspecified severe protein-calorie malnutrition: Secondary | ICD-10-CM | POA: Diagnosis not present

## 2023-07-13 DIAGNOSIS — Z1329 Encounter for screening for other suspected endocrine disorder: Secondary | ICD-10-CM | POA: Diagnosis not present

## 2023-07-13 DIAGNOSIS — F1019 Alcohol abuse with unspecified alcohol-induced disorder: Secondary | ICD-10-CM

## 2023-07-13 DIAGNOSIS — J449 Chronic obstructive pulmonary disease, unspecified: Secondary | ICD-10-CM | POA: Diagnosis not present

## 2023-07-13 MED ORDER — CARBOXYMETHYLCELLULOSE SODIUM 1 % OP SOLN: 1.0000 [drp] | Freq: Three times a day (TID) | OPHTHALMIC | 12 refills | Status: AC

## 2023-07-13 MED ORDER — PANTOPRAZOLE SODIUM 20 MG PO TBEC: 20.0000 mg | DELAYED_RELEASE_TABLET | Freq: Two times a day (BID) | ORAL | 3 refills | Status: DC

## 2023-07-13 NOTE — Assessment & Plan Note (Signed)
He currently smokes 1 pack/day of cigarettes and has been smoking since age 58.  He expresses a desire to quit smoking.  He was congratulated on taking the initial steps toward cessation.  We discussed selecting a quit date and reviewed medication options to assist with smoking cessation.  Follow-up in 2-4 weeks for reassessment

## 2023-07-13 NOTE — Assessment & Plan Note (Signed)
Previously documented history of COPD.  Currently smokes 1 pack/day of cigarettes.  Not prescribed any inhalers.  Pulmonary exam is unremarkable.  He is asymptomatic currently.  Counseled on smoking cessation.

## 2023-07-13 NOTE — Assessment & Plan Note (Signed)
History of alcohol abuse with a previously documented history of alcohol withdrawal complicated by seizure.  He recognizes that continued alcohol use is the underlying culprit of his current clinical condition.  He expresses a desire to quit drinking alcohol but is apprehensive due to a history of withdrawal complicated by seizures.  We briefly discussed strategies for alcohol withdrawal today but based on his history I feel that medically supervised alcohol withdrawal is the safest option.  Will arrange close follow-up to further discuss.  Baseline labs were ordered today.

## 2023-07-13 NOTE — Patient Instructions (Signed)
It was a pleasure to see you today.  Thank you for giving [place] the opportunity to be involved in your care.  Below is a brief recap of your visit and next steps.  We will plan to see you again in 2-4 weeks.  Summary You have established care today No medication changes were made Basic labs ordered Referrals placed to gastroenterology and pain management Follow up in 2-4 weeks for reassessment
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
## 2023-07-13 NOTE — Assessment & Plan Note (Addendum)
Symptoms are adequately controlled with pantoprazole.  Refill provided today.

## 2023-07-13 NOTE — Assessment & Plan Note (Signed)
He endorses dry eyes bilaterally and irritation each morning.  Requesting eyedrops for symptom relief.  Refresh eyedrops were prescribed today.

## 2023-07-13 NOTE — Assessment & Plan Note (Signed)
History of alcoholic cirrhosis complicated by ascites and esophageal varices.  Not seen by GI since 2019.  Previously prescribed Lasix and Aldactone but is off medication currently.  Continues to drink 8 beers per day. -Repeat labs ordered today.  Will arrange close follow-up in anticipation of resuming Lasix/Aldactone.  He appears fairly well compensated on exam today but describes periodic lower extremity edema. -New gastroenterology referral placed today -Will discuss strategies for alcohol cessation at follow-up

## 2023-07-13 NOTE — Assessment & Plan Note (Signed)
His acute concern today is chronic lumbar back pain with sciatica.  He is interested in options for pain management.  History of back surgery in 2014 (left lumbar laminectomy/decompression with microdiscectomy).  Not taking any medication for pain relief currently.  He is disabled because of back pain. -Treatment options for back pain were reviewed today.  Ultimately, he is in agreement with a referral to pain management to consider a multimodal approach to pain control.  Pain management referral placed today.

## 2023-07-14 ENCOUNTER — Encounter: Payer: Self-pay | Admitting: Internal Medicine

## 2023-07-14 LAB — LIPID PANEL
Chol/HDL Ratio: 2 {ratio} (ref 0.0–5.0)
Cholesterol, Total: 122 mg/dL (ref 100–199)
HDL: 60 mg/dL (ref >39)
LDL Chol Calc (NIH): 47 mg/dL (ref 0–99)
Triglycerides: 75 mg/dL (ref 0–149)
VLDL Cholesterol Cal: 15 mg/dL (ref 5–40)

## 2023-07-14 LAB — CBC WITH DIFFERENTIAL/PLATELET
Basophils Absolute: 0.1 10*3/uL (ref 0.0–0.2)
Basos: 1 %
EOS (ABSOLUTE): 0.1 10*3/uL (ref 0.0–0.4)
Eos: 2 %
Hematocrit: 35.3 % — ABNORMAL LOW (ref 37.5–51.0)
Hemoglobin: 11.8 g/dL — ABNORMAL LOW (ref 13.0–17.7)
Immature Grans (Abs): 0 10*3/uL (ref 0.0–0.1)
Immature Granulocytes: 0 %
Lymphocytes Absolute: 1.4 10*3/uL (ref 0.7–3.1)
Lymphs: 39 %
MCH: 28.9 pg (ref 26.6–33.0)
MCHC: 33.4 g/dL (ref 31.5–35.7)
MCV: 86 fL (ref 79–97)
Monocytes Absolute: 0.5 10*3/uL (ref 0.1–0.9)
Monocytes: 13 %
Neutrophils Absolute: 1.5 10*3/uL (ref 1.4–7.0)
Neutrophils: 45 %
Platelets: 187 10*3/uL (ref 150–450)
RBC: 4.09 x10E6/uL — ABNORMAL LOW (ref 4.14–5.80)
RDW: 16.9 % — ABNORMAL HIGH (ref 11.6–15.4)
WBC: 3.5 10*3/uL (ref 3.4–10.8)

## 2023-07-14 LAB — VITAMIN D 25 HYDROXY (VIT D DEFICIENCY, FRACTURES): Vit D, 25-Hydroxy: 25.3 ng/mL — ABNORMAL LOW (ref 30.0–100.0)

## 2023-07-14 LAB — B12 AND FOLATE PANEL
Folate: 6.1 ng/mL (ref >3.0)
Vitamin B-12: 1014 pg/mL (ref 232–1245)

## 2023-07-14 LAB — CMP14+EGFR
ALT: 22 [IU]/L (ref 0–44)
AST: 58 [IU]/L — ABNORMAL HIGH (ref 0–40)
Albumin: 3.1 g/dL — ABNORMAL LOW (ref 3.8–4.9)
Alkaline Phosphatase: 108 [IU]/L (ref 44–121)
BUN/Creatinine Ratio: 8 — ABNORMAL LOW (ref 9–20)
BUN: 4 mg/dL — ABNORMAL LOW (ref 6–24)
Bilirubin Total: 2.7 mg/dL — ABNORMAL HIGH (ref 0.0–1.2)
CO2: 22 mmol/L (ref 20–29)
Calcium: 8.1 mg/dL — ABNORMAL LOW (ref 8.7–10.2)
Chloride: 98 mmol/L (ref 96–106)
Creatinine, Ser: 0.53 mg/dL — ABNORMAL LOW (ref 0.76–1.27)
Globulin, Total: 4.1 g/dL (ref 1.5–4.5)
Glucose: 133 mg/dL — ABNORMAL HIGH (ref 70–99)
Potassium: 4.2 mmol/L (ref 3.5–5.2)
Sodium: 133 mmol/L — ABNORMAL LOW (ref 134–144)
Total Protein: 7.2 g/dL (ref 6.0–8.5)
eGFR: 118 mL/min/{1.73_m2} (ref >59)

## 2023-07-14 LAB — HEMOGLOBIN A1C
Est. average glucose Bld gHb Est-mCnc: 82 mg/dL
Hgb A1c MFr Bld: 4.5 % — ABNORMAL LOW (ref 4.8–5.6)

## 2023-07-14 LAB — TSH+FREE T4
Free T4: 1.14 ng/dL (ref 0.82–1.77)
TSH: 2.12 u[IU]/mL (ref 0.450–4.500)

## 2023-07-27 ENCOUNTER — Ambulatory Visit (INDEPENDENT_AMBULATORY_CARE_PROVIDER_SITE_OTHER): Payer: Medicare PPO | Admitting: Internal Medicine

## 2023-07-27 ENCOUNTER — Encounter: Payer: Self-pay | Admitting: Internal Medicine

## 2023-07-27 VITALS — BP 167/77 | HR 110 | Ht 69.0 in | Wt 153.2 lb

## 2023-07-27 DIAGNOSIS — G8929 Other chronic pain: Secondary | ICD-10-CM | POA: Diagnosis not present

## 2023-07-27 DIAGNOSIS — M5442 Lumbago with sciatica, left side: Secondary | ICD-10-CM | POA: Diagnosis not present

## 2023-07-27 DIAGNOSIS — E559 Vitamin D deficiency, unspecified: Secondary | ICD-10-CM | POA: Diagnosis not present

## 2023-07-27 DIAGNOSIS — K219 Gastro-esophageal reflux disease without esophagitis: Secondary | ICD-10-CM | POA: Diagnosis not present

## 2023-07-27 DIAGNOSIS — F1019 Alcohol abuse with unspecified alcohol-induced disorder: Secondary | ICD-10-CM

## 2023-07-27 DIAGNOSIS — K7031 Alcoholic cirrhosis of liver with ascites: Secondary | ICD-10-CM

## 2023-07-27 DIAGNOSIS — M5441 Lumbago with sciatica, right side: Secondary | ICD-10-CM | POA: Diagnosis not present

## 2023-07-27 DIAGNOSIS — Z720 Tobacco use: Secondary | ICD-10-CM | POA: Diagnosis not present

## 2023-07-27 MED ORDER — PANTOPRAZOLE SODIUM 20 MG PO TBEC: 20.0000 mg | DELAYED_RELEASE_TABLET | Freq: Two times a day (BID) | ORAL | 3 refills | Status: DC

## 2023-07-27 MED ORDER — SPIRONOLACTONE 100 MG PO TABS: 100.0000 mg | ORAL_TABLET | Freq: Every day | ORAL | 3 refills | Status: DC

## 2023-07-27 MED ORDER — FUROSEMIDE 40 MG PO TABS: 40.0000 mg | ORAL_TABLET | Freq: Every day | ORAL | 3 refills | Status: DC | PRN

## 2023-07-27 NOTE — Patient Instructions (Signed)
 It was a pleasure to see you today.  Thank you for giving us  the opportunity to be involved in your care.  Below is a brief recap of your visit and next steps.  We will plan to see you again in 3 months.  Summary Resume spironolactone  100 mg daily Lasix  40 mg as needed for swelling Repeat chemistry panel in 2 weeks Follow up in 3 months
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
## 2023-07-28 DIAGNOSIS — E559 Vitamin D deficiency, unspecified: Secondary | ICD-10-CM | POA: Insufficient documentation

## 2023-07-28 NOTE — Assessment & Plan Note (Signed)
 He previously endorsed making 1 pack/day of cigarettes and has been smoking since age 58.  He was counseled on smoking cessation reports today that he has significantly cut back on the number of cigarettes he smokes per day. -The patient was congratulated on his progress and again counseled on the dangers of tobacco use.  He was advised to quit.  Reviewed strategies to maximize success, including removing cigarettes and smoking materials from environment, stress management, substitution of other forms of reinforcement, support of family/friends, and written materials.

## 2023-07-28 NOTE — Assessment & Plan Note (Signed)
 Known history of alcoholic cirrhosis complicated by ascites and esophageal varices.  He was referred to gastroenterology at his last appointment.  No current appointment is scheduled.  Will assist with scheduling.  No significant ascites or lower extremity edema appreciated on exam today.  He has reduced alcohol intake and says he is down to drinking 1 beer daily. -Complete cessation from alcohol was strongly encouraged today -Resume spironolactone  at 100 mg daily and Lasix  40 mg daily as needed for edema -Previously referred to gastroenterology -Repeat BMP in 2 weeks

## 2023-07-28 NOTE — Assessment & Plan Note (Signed)
Noted on recent labs.  Daily vitamin D supplementation recommended. 

## 2023-07-28 NOTE — Assessment & Plan Note (Signed)
 Symptoms remain well-controlled with pantoprazole  20 mg twice daily.

## 2023-07-28 NOTE — Assessment & Plan Note (Signed)
 He previously endorsed drinking 8 beers per day and had a documented history of alcohol withdrawal complicated by seizure.  He was counseled on abstaining from alcohol use as he recognized that it is the underlying cause of his current clinical condition.  We discussed strategies for alcohol cessation and today he reports that he has reduced alcohol consumption to 1 beer daily.  He plans to completely quit eventually.

## 2023-07-28 NOTE — Assessment & Plan Note (Signed)
 He continues to endorse chronic musculoskeletal pain.  Previously referred to pain management but has not been contacted to schedule an appointment.  We will follow-up on the status of this referral.

## 2023-08-13 ENCOUNTER — Encounter (INDEPENDENT_AMBULATORY_CARE_PROVIDER_SITE_OTHER): Payer: Self-pay | Admitting: *Deleted

## 2023-09-03 ENCOUNTER — Other Ambulatory Visit: Payer: Self-pay

## 2023-09-03 DIAGNOSIS — K7031 Alcoholic cirrhosis of liver with ascites: Secondary | ICD-10-CM

## 2023-09-03 DIAGNOSIS — K219 Gastro-esophageal reflux disease without esophagitis: Secondary | ICD-10-CM

## 2023-09-03 MED ORDER — PANTOPRAZOLE SODIUM 20 MG PO TBEC: 20.0000 mg | DELAYED_RELEASE_TABLET | Freq: Two times a day (BID) | ORAL | 3 refills | Status: AC

## 2023-09-03 MED ORDER — FUROSEMIDE 40 MG PO TABS: 40.0000 mg | ORAL_TABLET | Freq: Every day | ORAL | 3 refills | Status: AC | PRN

## 2023-09-03 MED ORDER — SPIRONOLACTONE 100 MG PO TABS: 100.0000 mg | ORAL_TABLET | Freq: Every day | ORAL | 3 refills | Status: AC

## 2023-10-12 ENCOUNTER — Telehealth: Payer: Self-pay | Admitting: Internal Medicine
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
## 2023-10-15 NOTE — Telephone Encounter (Signed)
 Patient advised.

## 2023-10-19 ENCOUNTER — Encounter: Payer: Self-pay | Admitting: Gastroenterology

## 2023-10-23 ENCOUNTER — Ambulatory Visit: Admitting: Gastroenterology
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
## 2023-10-24 ENCOUNTER — Encounter: Payer: Self-pay | Admitting: Gastroenterology

## 2023-10-25 ENCOUNTER — Ambulatory Visit: Payer: Medicare PPO | Admitting: Internal Medicine

## 2023-11-26 ENCOUNTER — Ambulatory Visit: Admitting: Gastroenterology
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
## 2023-11-30 ENCOUNTER — Encounter: Payer: Self-pay | Admitting: Internal Medicine

## 2023-11-30 ENCOUNTER — Ambulatory Visit: Admitting: Internal Medicine

## 2023-11-30 VITALS — BP 133/74 | HR 105 | Ht 69.0 in | Wt 152.0 lb

## 2023-11-30 DIAGNOSIS — K7031 Alcoholic cirrhosis of liver with ascites: Secondary | ICD-10-CM

## 2023-11-30 DIAGNOSIS — K219 Gastro-esophageal reflux disease without esophagitis: Secondary | ICD-10-CM | POA: Diagnosis not present

## 2023-11-30 DIAGNOSIS — J449 Chronic obstructive pulmonary disease, unspecified: Secondary | ICD-10-CM

## 2023-11-30 DIAGNOSIS — I10 Essential (primary) hypertension: Secondary | ICD-10-CM

## 2023-11-30 DIAGNOSIS — F1019 Alcohol abuse with unspecified alcohol-induced disorder: Secondary | ICD-10-CM

## 2023-11-30 NOTE — Assessment & Plan Note (Signed)
 He appears compensated on exam.  Previously referred to gastroenterology and currently has an appointment scheduled for 7/1.  He is taking spironolactone  100 mg daily and Lasix  40 mg daily as needed for edema.  Repeat labs ordered today.

## 2023-11-30 NOTE — Assessment & Plan Note (Signed)
 Remains adequately controlled.  No medication changes are indicated today.
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
## 2023-11-30 NOTE — Assessment & Plan Note (Signed)
 He has cut back on alcohol consumption, noting that a 12 pack of beer typically lasts him a week.  He was congratulated on making efforts to reduce alcohol intake.  Complete cessation was once again strongly encouraged.

## 2023-11-30 NOTE — Assessment & Plan Note (Signed)
 Symptoms are adequately controlled with pantoprazole 

## 2023-11-30 NOTE — Patient Instructions (Signed)
 It was a pleasure to see you today.  Thank you for giving [place] the opportunity to be involved in your care.  Below is a brief recap of your visit and next steps.  We will plan to see you again in 3 months.  Summary No medication changes today Repeat labs ordered Follow up in 3 months

## 2023-11-30 NOTE — Assessment & Plan Note (Signed)
 Asymptomatic currently.  Pulmonary exam is unremarkable.  He has cut back on smoking to less than a pack per day of cigarettes.  Complete cessation was encouraged.

## 2023-12-01 ENCOUNTER — Ambulatory Visit: Payer: Self-pay | Admitting: Internal Medicine

## 2023-12-01 LAB — CMP14+EGFR
ALT: 34 IU/L (ref 0–44)
AST: 101 IU/L — ABNORMAL HIGH (ref 0–40)
Albumin: 3 g/dL — ABNORMAL LOW (ref 3.8–4.9)
Alkaline Phosphatase: 145 IU/L — ABNORMAL HIGH (ref 44–121)
BUN/Creatinine Ratio: 9 (ref 9–20)
BUN: 5 mg/dL — ABNORMAL LOW (ref 6–24)
Bilirubin Total: 4.6 mg/dL — ABNORMAL HIGH (ref 0.0–1.2)
CO2: 24 mmol/L (ref 20–29)
Calcium: 7.9 mg/dL — ABNORMAL LOW (ref 8.7–10.2)
Chloride: 98 mmol/L (ref 96–106)
Creatinine, Ser: 0.58 mg/dL — ABNORMAL LOW (ref 0.76–1.27)
Globulin, Total: 3.7 g/dL (ref 1.5–4.5)
Glucose: 118 mg/dL — ABNORMAL HIGH (ref 70–99)
Potassium: 3.5 mmol/L (ref 3.5–5.2)
Sodium: 137 mmol/L (ref 134–144)
Total Protein: 6.7 g/dL (ref 6.0–8.5)
eGFR: 114 mL/min/{1.73_m2} (ref >59)

## 2023-12-01 LAB — CBC WITH DIFFERENTIAL/PLATELET
Basophils Absolute: 0 10*3/uL (ref 0.0–0.2)
Basos: 1 %
EOS (ABSOLUTE): 0.1 10*3/uL (ref 0.0–0.4)
Eos: 2 %
Hematocrit: 37.7 % (ref 37.5–51.0)
Hemoglobin: 12.5 g/dL — ABNORMAL LOW (ref 13.0–17.7)
Immature Grans (Abs): 0 10*3/uL (ref 0.0–0.1)
Immature Granulocytes: 0 %
Lymphocytes Absolute: 1.6 10*3/uL (ref 0.7–3.1)
Lymphs: 39 %
MCH: 31.3 pg (ref 26.6–33.0)
MCHC: 33.2 g/dL (ref 31.5–35.7)
MCV: 94 fL (ref 79–97)
Monocytes Absolute: 0.8 10*3/uL (ref 0.1–0.9)
Monocytes: 19 %
Neutrophils Absolute: 1.5 10*3/uL (ref 1.4–7.0)
Neutrophils: 39 %
Platelets: 129 10*3/uL — ABNORMAL LOW (ref 150–450)
RBC: 4 x10E6/uL — ABNORMAL LOW (ref 4.14–5.80)
RDW: 17.5 % — ABNORMAL HIGH (ref 11.6–15.4)
WBC: 4 10*3/uL (ref 3.4–10.8)

## 2023-12-01 LAB — PROTIME-INR
INR: 1.3 — ABNORMAL HIGH (ref 0.9–1.2)
Prothrombin Time: 14.7 s — ABNORMAL HIGH (ref 9.1–12.0)

## 2023-12-11 DIAGNOSIS — J410 Simple chronic bronchitis: Secondary | ICD-10-CM | POA: Diagnosis not present

## 2023-12-11 DIAGNOSIS — I2510 Atherosclerotic heart disease of native coronary artery without angina pectoris: Secondary | ICD-10-CM | POA: Diagnosis not present

## 2023-12-11 DIAGNOSIS — F1020 Alcohol dependence, uncomplicated: Secondary | ICD-10-CM | POA: Diagnosis not present

## 2023-12-11 DIAGNOSIS — Z008 Encounter for other general examination: Secondary | ICD-10-CM | POA: Diagnosis not present

## 2023-12-11 DIAGNOSIS — F17210 Nicotine dependence, cigarettes, uncomplicated: Secondary | ICD-10-CM | POA: Diagnosis not present

## 2023-12-11 DIAGNOSIS — D696 Thrombocytopenia, unspecified: Secondary | ICD-10-CM | POA: Diagnosis not present

## 2023-12-11 DIAGNOSIS — R2681 Unsteadiness on feet: Secondary | ICD-10-CM | POA: Diagnosis not present

## 2023-12-11 DIAGNOSIS — I10 Essential (primary) hypertension: Secondary | ICD-10-CM | POA: Diagnosis not present

## 2023-12-11 DIAGNOSIS — K219 Gastro-esophageal reflux disease without esophagitis: Secondary | ICD-10-CM | POA: Diagnosis not present

## 2023-12-11 DIAGNOSIS — K7030 Alcoholic cirrhosis of liver without ascites: Secondary | ICD-10-CM | POA: Diagnosis not present

## 2023-12-18 ENCOUNTER — Ambulatory Visit: Admitting: Gastroenterology
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
## 2024-02-05 ENCOUNTER — Ambulatory Visit

## 2024-02-05 VITALS — Ht 69.5 in | Wt 150.0 lb

## 2024-02-05 DIAGNOSIS — Z0001 Encounter for general adult medical examination with abnormal findings: Secondary | ICD-10-CM | POA: Diagnosis not present

## 2024-02-05 DIAGNOSIS — F17210 Nicotine dependence, cigarettes, uncomplicated: Secondary | ICD-10-CM

## 2024-02-05 DIAGNOSIS — Z0000 Encounter for general adult medical examination without abnormal findings: Secondary | ICD-10-CM

## 2024-02-05 NOTE — Patient Instructions (Signed)
 Arthur Johnson , Thank you for taking time out of your busy schedule to complete your Annual Wellness Visit with me. I enjoyed our conversation and look forward to speaking with you again next year. I, as well as your care team,  appreciate your ongoing commitment to your health goals. Please review the following plan we discussed and let me know if I can assist you in the future. Your Game plan/ To Do List    Referrals: If you haven't heard from the office you've been referred to, please reach out to them at the phone provided.  Lung Cancer Screening- Office [address]-First Floor Medical Building directly across from [facility] Phone Number:[phone]  Follow up Visits: We will see or speak with you next year for your Next Medicare AWV with our clinical staff  Clinician Recommendations:  Aim for 30 minutes of exercise or brisk walking, 6-8 glasses of water, and 5 servings of fruits and vegetables each day.    Wishing you many blessings and good health during the next year until our next visit.  -Mavi Un   This is a list of the screenings recommended for you:  Health Maintenance  Topic Date Due   DTaP/Tdap/Td vaccine (1 - Tdap) Never done   Hepatitis B Vaccine (1 of 3 - 19+ 3-dose series) Never done   Zoster (Shingles) Vaccine (1 of 2) Never done   Screening for Lung Cancer  05/02/2019   Pneumococcal Vaccine for age over 25 (2 of 2 - PCV) 05/02/2019   COVID-19 Vaccine (1 - 2024-25 season) Never done   Flu Shot  01/18/2024   Medicare Annual Wellness Visit  02/04/2025   Colon Cancer Screening  05/11/2027   Hepatitis C Screening  Completed   HIV Screening  Completed   HPV Vaccine  Aged Out   Meningitis B Vaccine  Aged Out   Pneumococcal Vaccine  Discontinued    Advanced directives: (Declined) Advance directive discussed with you today. Even though you declined this today, please call our office should you change your mind, and we can give you the proper paperwork for  you to fill out. Advance Care Planning is important because it:  [x]  Makes sure you receive the medical care that is consistent with your values, goals, and preferences  [x]  It provides guidance to your family and loved ones and reduces their decisional burden about whether or not they are making the right decisions based on your wishes.  Follow the link provided in your after visit summary or read over the paperwork we have mailed to you to help you started getting your Advance Directives in place. If you need assistance in completing these, please reach out to us  so that we can help you!  See attachments for Preventive Care and Fall Prevention Tips.
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
## 2024-02-22 ENCOUNTER — Ambulatory Visit

## 2024-06-09 ENCOUNTER — Ambulatory Visit

## 2024-09-04 ENCOUNTER — Ambulatory Visit: Payer: Self-pay

## 2025-02-09 ENCOUNTER — Ambulatory Visit
